# Patient Record
Sex: Female | Born: 1980 | Race: Black or African American | Hispanic: No | Marital: Married | Smoking: Never smoker
Health system: Southern US, Community
[De-identification: ages and names within clinical notes are randomized; demographics above are authoritative.]

## PROBLEM LIST (undated history)

## (undated) DIAGNOSIS — Z789 Other specified health status: Secondary | ICD-10-CM

## (undated) HISTORY — DX: Other specified health status: Z78.9

## (undated) HISTORY — PX: NO PAST SURGERIES: SHX2092

---
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
## 2002-10-25 ENCOUNTER — Emergency Department (HOSPITAL_COMMUNITY): Admission: EM | Admit: 2002-10-25 | Discharge: 2002-10-25 | Payer: Self-pay | Admitting: Emergency Medicine

## 2003-11-10 ENCOUNTER — Inpatient Hospital Stay (HOSPITAL_COMMUNITY): Admission: AD | Admit: 2003-11-10 | Discharge: 2003-11-21 | Payer: Self-pay | Admitting: Psychiatry

## 2005-01-05 ENCOUNTER — Ambulatory Visit (HOSPITAL_COMMUNITY): Admission: RE | Admit: 2005-01-05 | Discharge: 2005-01-05 | Payer: Self-pay | Admitting: Obstetrics & Gynecology

## 2005-03-28 ENCOUNTER — Ambulatory Visit (HOSPITAL_COMMUNITY): Admission: RE | Admit: 2005-03-28 | Discharge: 2005-03-28 | Payer: Self-pay | Admitting: Obstetrics

## 2005-04-14 ENCOUNTER — Ambulatory Visit (HOSPITAL_COMMUNITY): Admission: RE | Admit: 2005-04-14 | Discharge: 2005-04-14 | Payer: Self-pay | Admitting: Obstetrics

## 2005-08-21 ENCOUNTER — Inpatient Hospital Stay (HOSPITAL_COMMUNITY): Admission: AD | Admit: 2005-08-21 | Discharge: 2005-08-23 | Payer: Self-pay | Admitting: Obstetrics

## 2006-07-31 IMAGING — US US OB COMP LESS 14 WK
1 series · 18 of 28 positions shown · non-contrast
Comparison: none

CLINICAL DATA: Assess viability.

[Series 1: us ob comp less 14 wks · 18 of 41 slices shown]
[im 1/41]
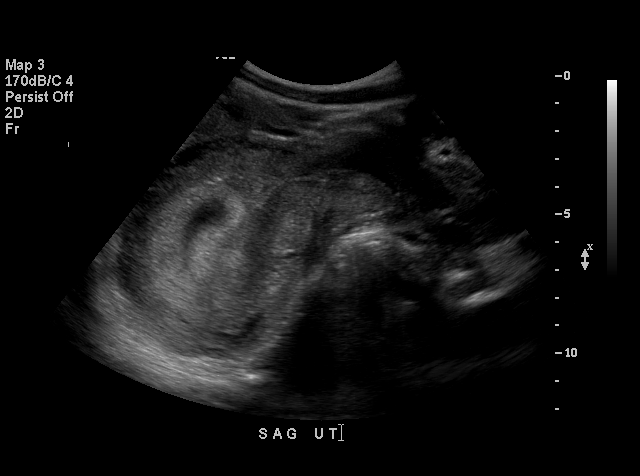
[im 3/41]
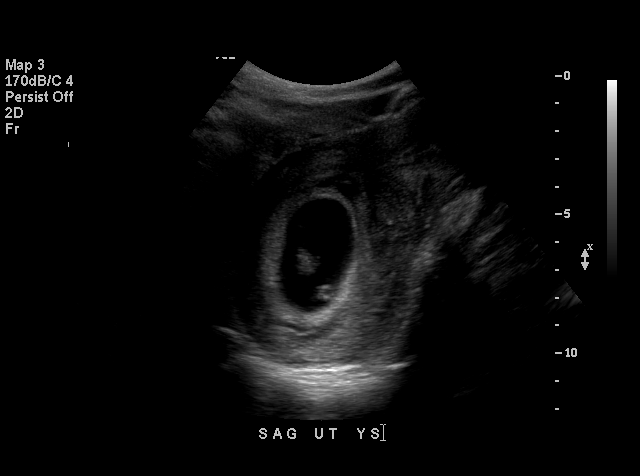
[im 5/41]
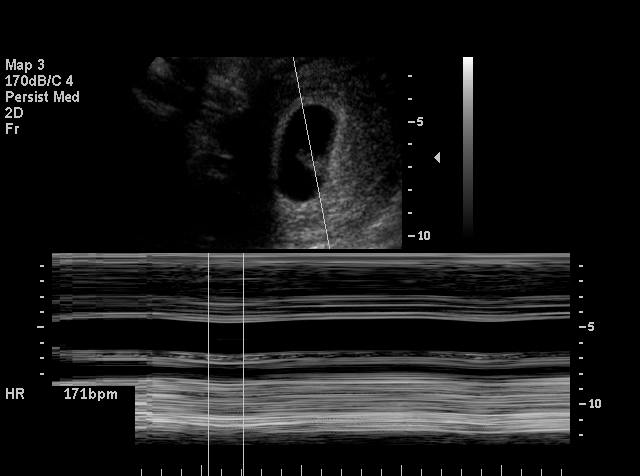
[im 8/41]
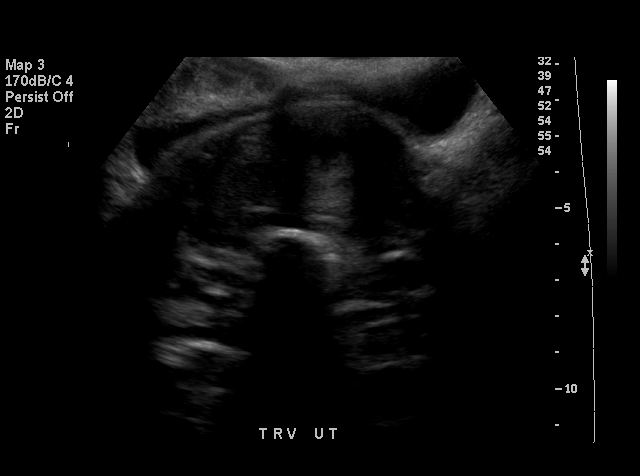
[im 11/41]
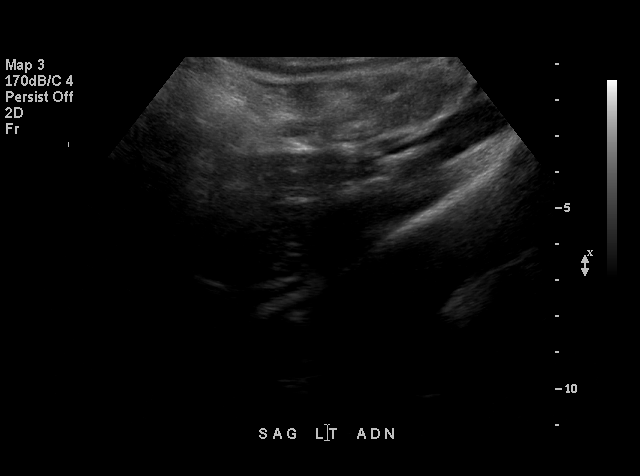
[im 12/41]
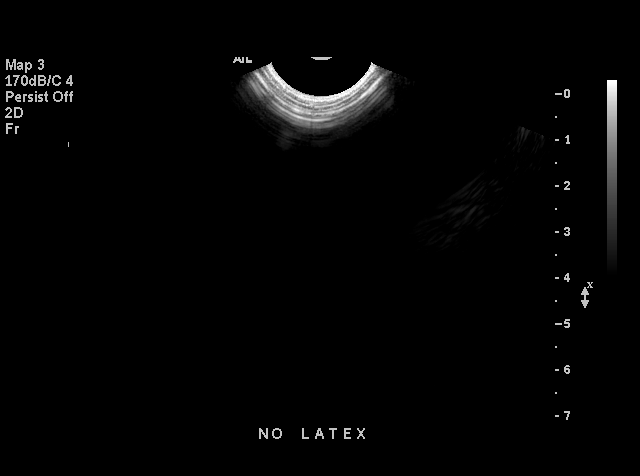
[im 15/41]
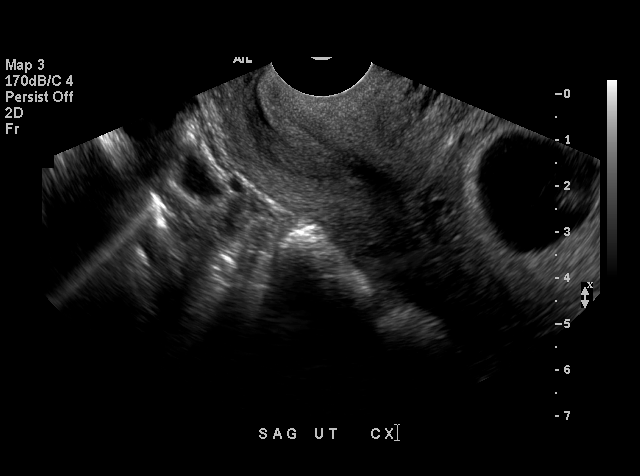
[im 17/41]
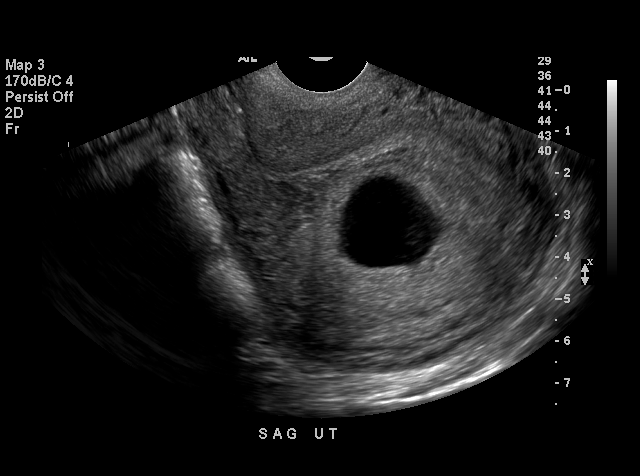
[im 20/41]
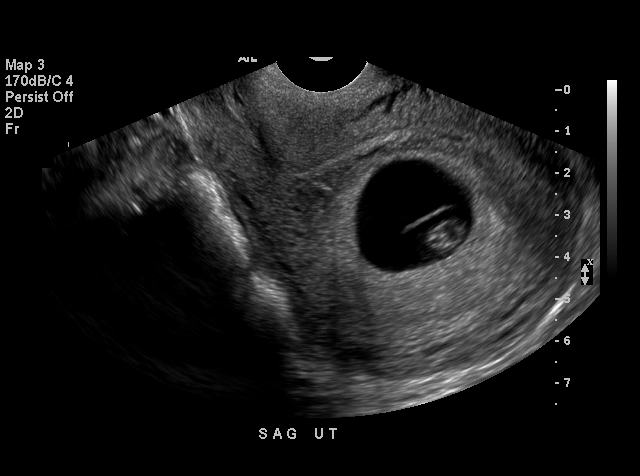
[im 21/41]
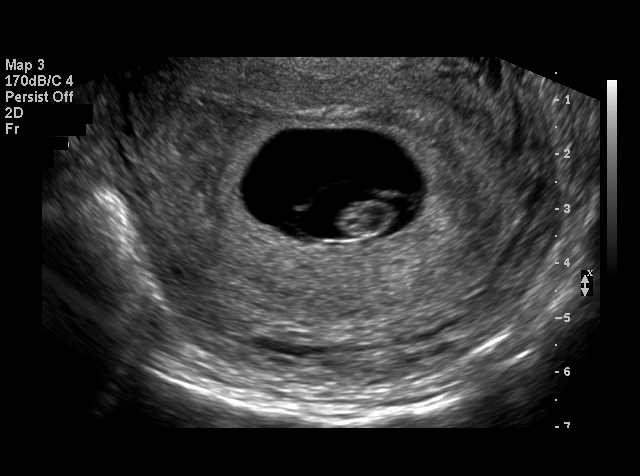
[im 24/41]
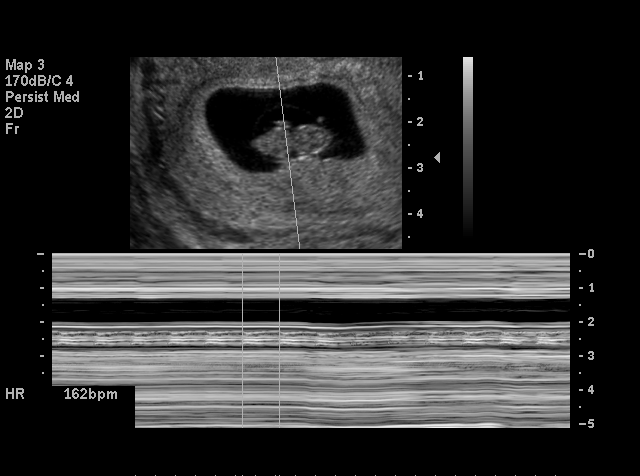
[im 26/41]
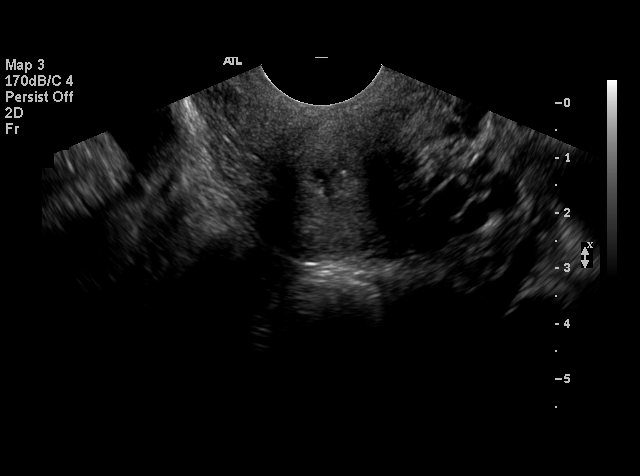
[im 29/41]
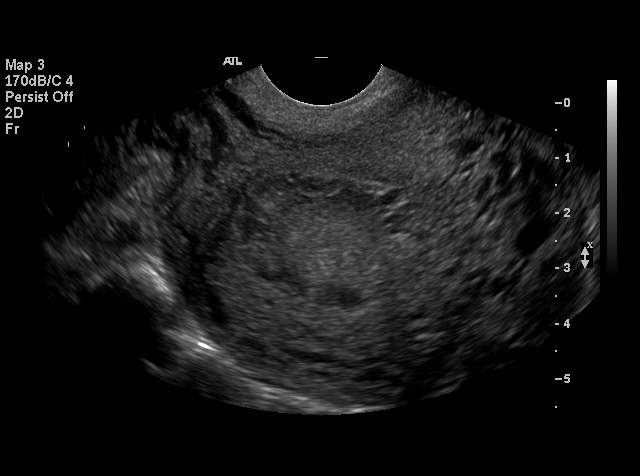
[im 32/41]
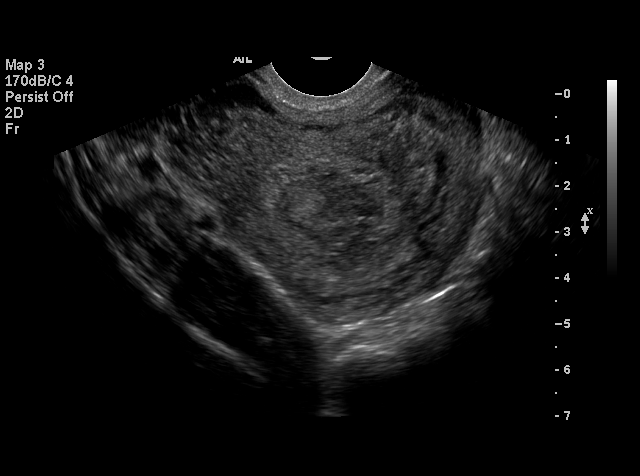
[im 33/41]
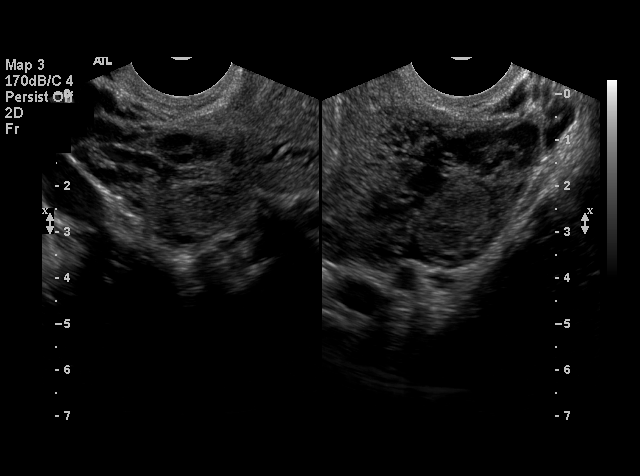
[im 36/41]
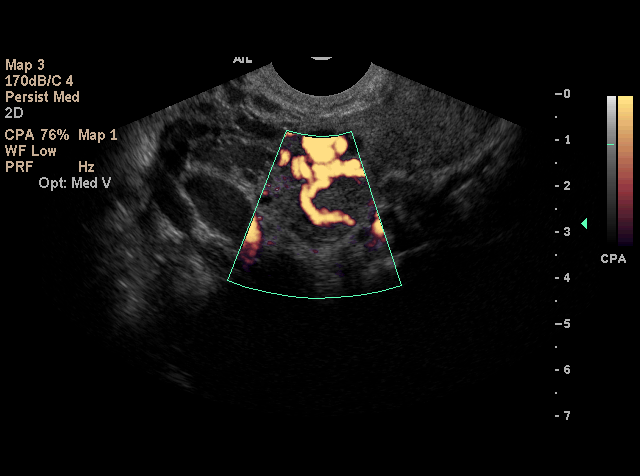
[im 38/41]
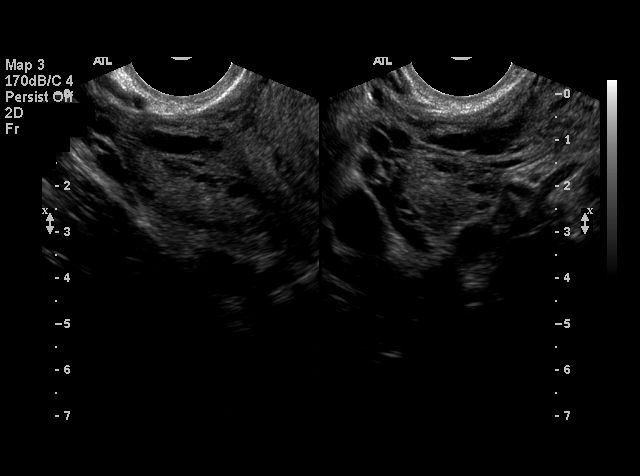
[im 41/41]
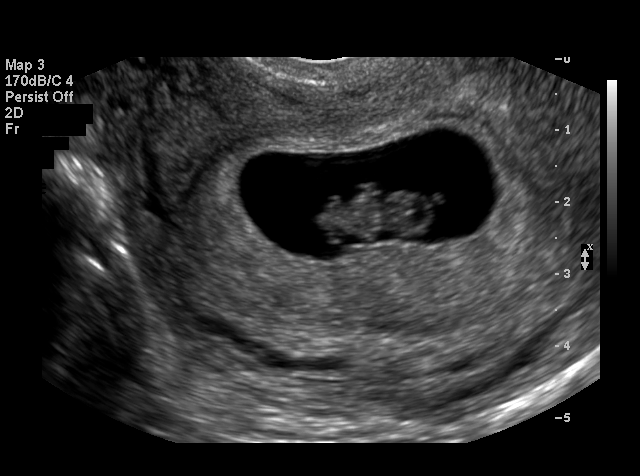

[18 of 28 positions shown; findings below may reference images not displayed]

OBSTETRICAL ULTRASOUND:

 Number of Fetuses:  1
 Heart Rate:   171 bpm
 Amniotic fluid:  Normal
 CRL:  1.8 cm   8 w 2 d

 Fetal anatomy could not be evaluated due to the early gestational age.  Yolk sac and amnion are visualized.  

 MATERNAL UTERINE AND ADNEXAL FINDINGS
 Cervix not evaluated.  
 Normal right ovary.  Resolving corpus luteum cyst on the left ovary.
IMPRESSION: 1.  Single living intrauterine gestation identified with a visualized yolk sac and embryo.  Embryonic heart rate is measured at 171 bpm.
 2.  Left ovarian corpus luteum cyst noted.  Right ovary is sonographically normal.
 3.  Estimated gestational age is 8 weeks 2 days by crown rump length.

## 2007-02-22 ENCOUNTER — Ambulatory Visit (HOSPITAL_COMMUNITY): Admission: RE | Admit: 2007-02-22 | Discharge: 2007-02-22 | Payer: Self-pay | Admitting: Obstetrics

## 2007-07-21 ENCOUNTER — Inpatient Hospital Stay (HOSPITAL_COMMUNITY): Admission: AD | Admit: 2007-07-21 | Discharge: 2007-07-23 | Payer: Self-pay | Admitting: Obstetrics & Gynecology

## 2008-10-07 ENCOUNTER — Ambulatory Visit (HOSPITAL_COMMUNITY): Admission: RE | Admit: 2008-10-07 | Discharge: 2008-10-07 | Payer: Self-pay | Admitting: Obstetrics

## 2009-03-09 ENCOUNTER — Inpatient Hospital Stay (HOSPITAL_COMMUNITY): Admission: AD | Admit: 2009-03-09 | Discharge: 2009-03-11 | Payer: Self-pay | Admitting: Obstetrics & Gynecology

## 2010-05-02 IMAGING — US US OB DETAIL+14 WK
1 series · 14 of 28 positions shown · non-contrast
Comparison: none

OBSTETRICAL ULTRASOUND:
 This ultrasound exam was performed in the [HOSPITAL] Ultrasound Department.  The OB US report was generated in the AS system, and faxed to the ordering physician.  This report is also available in [REDACTED] PACS.

[Series 1: us ob detail +14 wk · 0.22mm/px · 86 acquisitions, 14 frames shown]
[im 4/86]
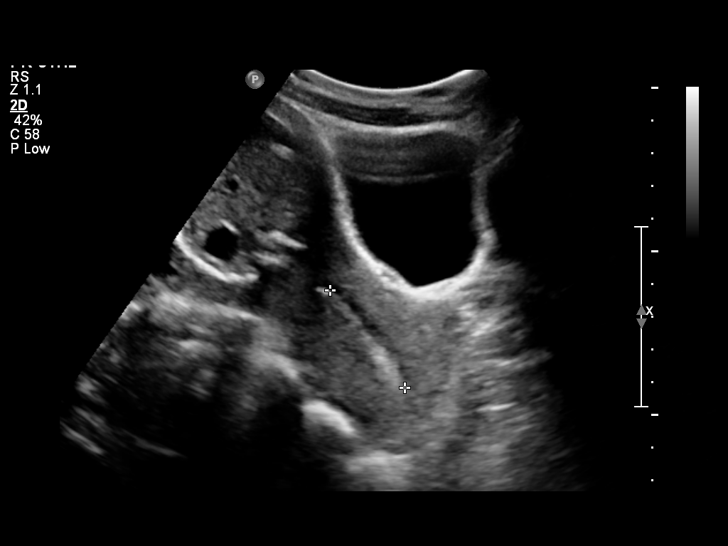
[im 10/86]
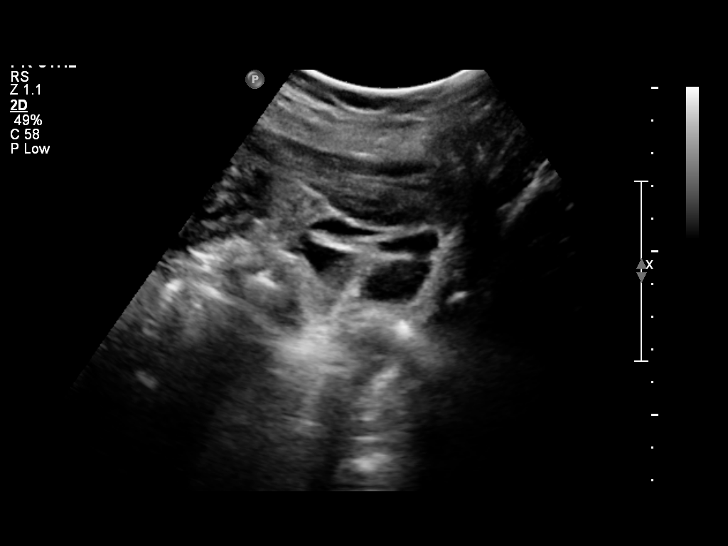
[im 16/86]
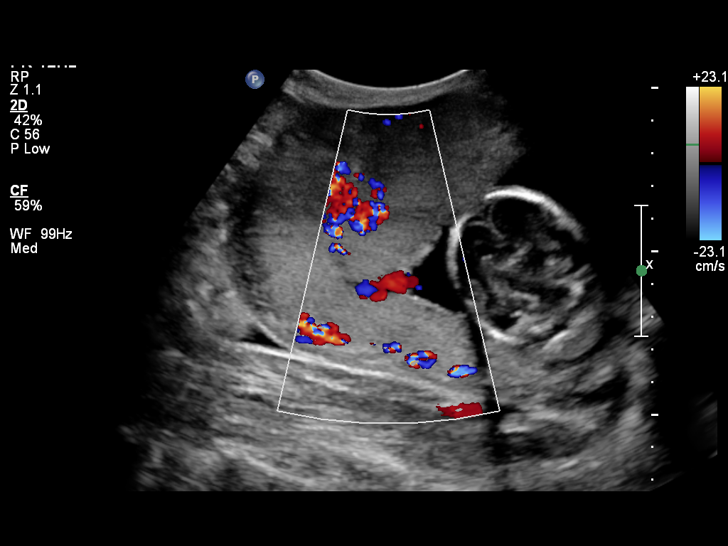
[im 23/86]
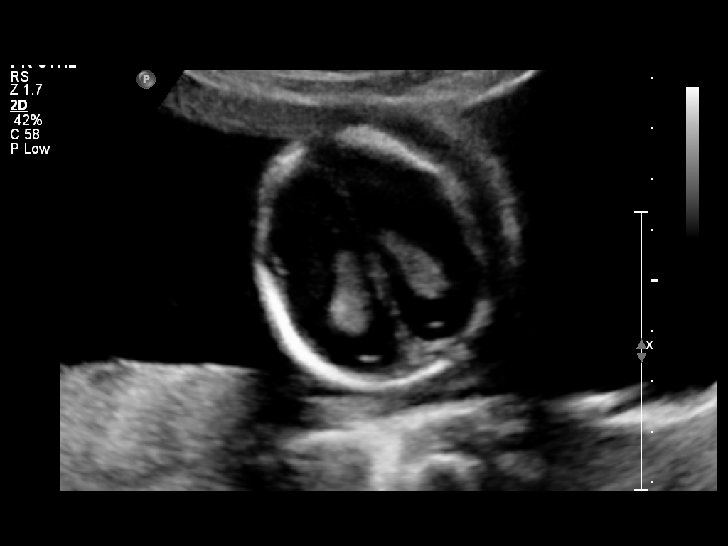
[im 29/86]
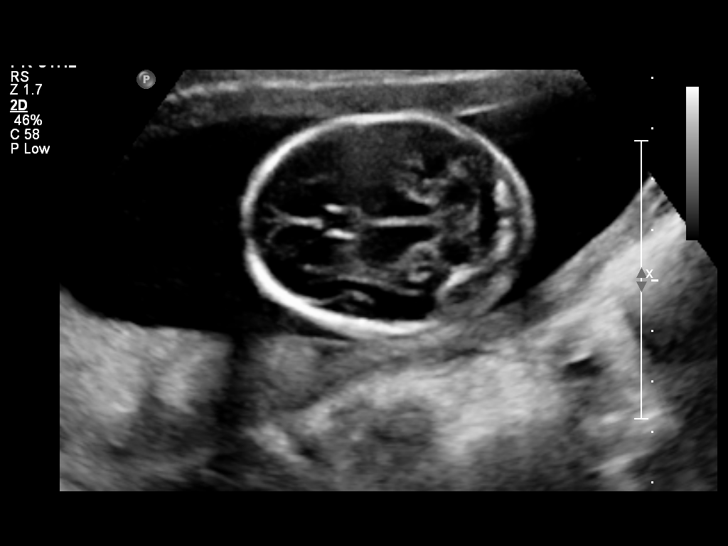
[im 35/86]
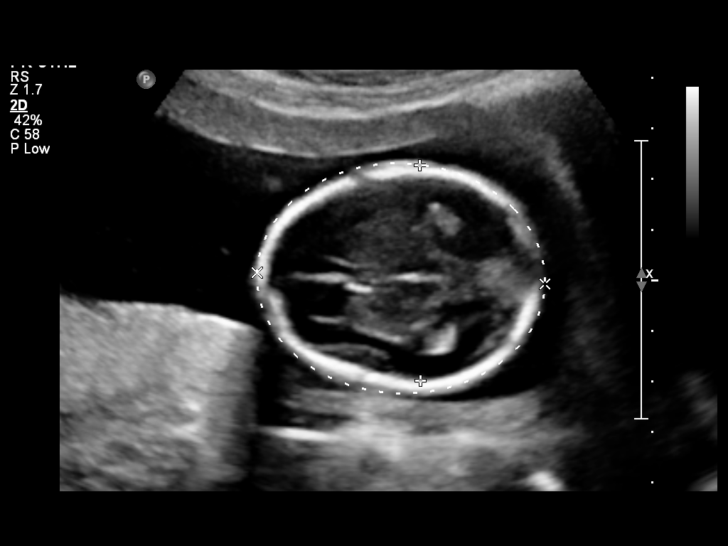
[im 41/86]
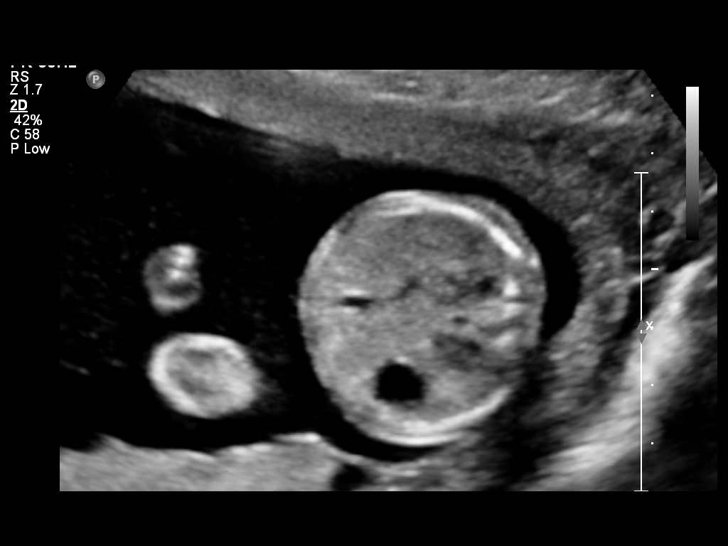
[im 48/86]
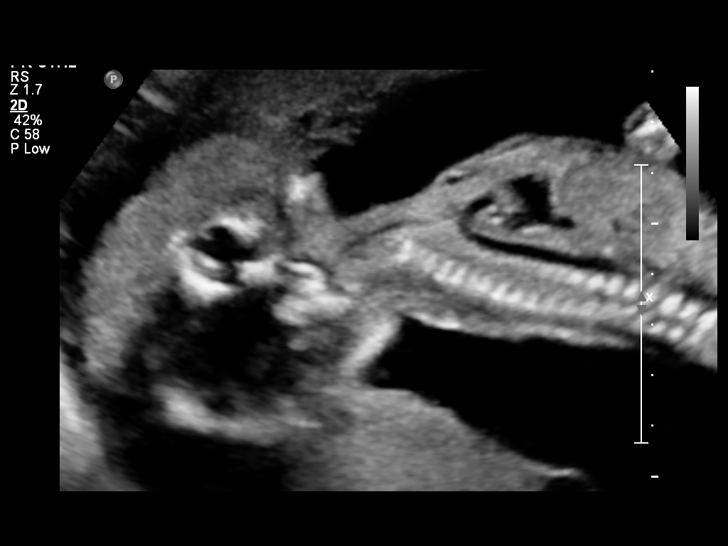
[im 54/86]
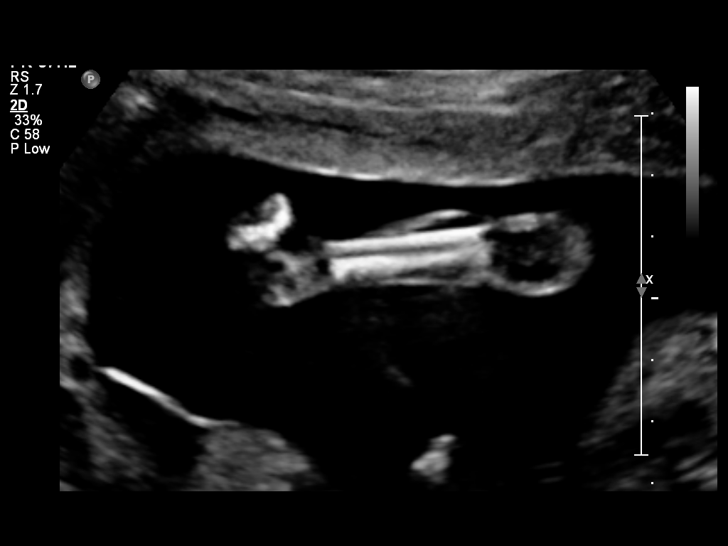
[im 60/86]
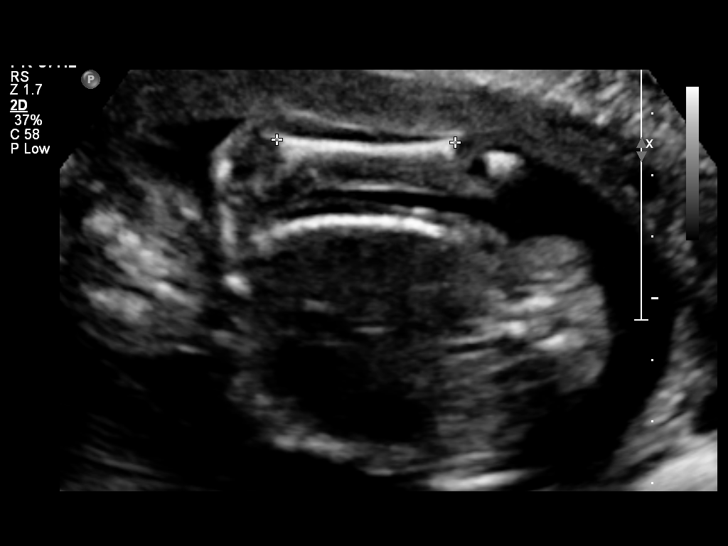
[im 67/86]
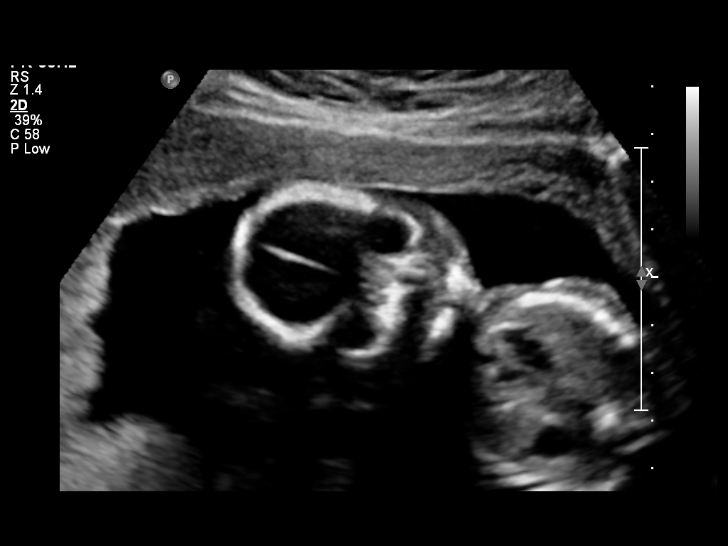
[im 73/86]
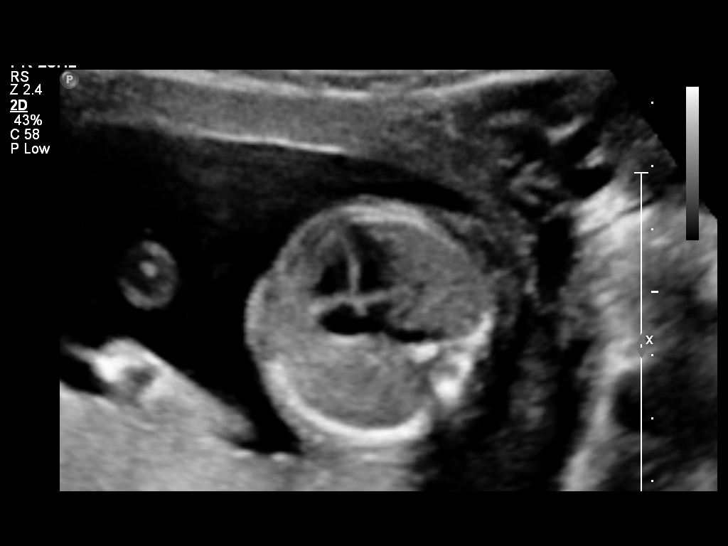
[im 79/86]
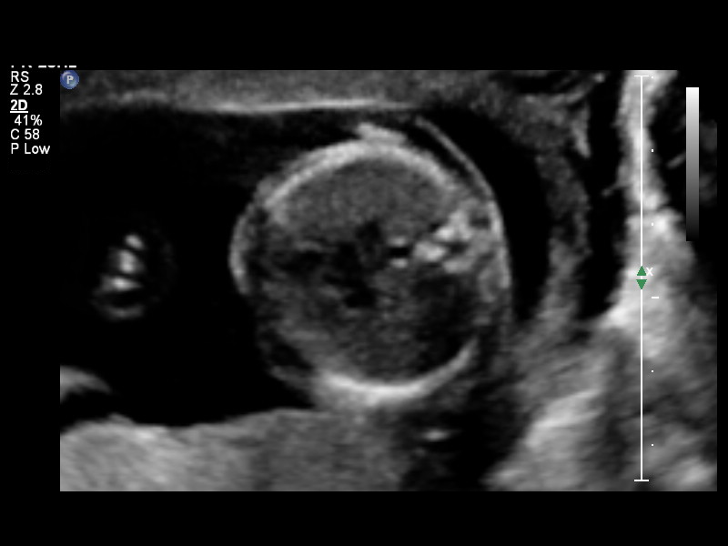
[im 86/86]
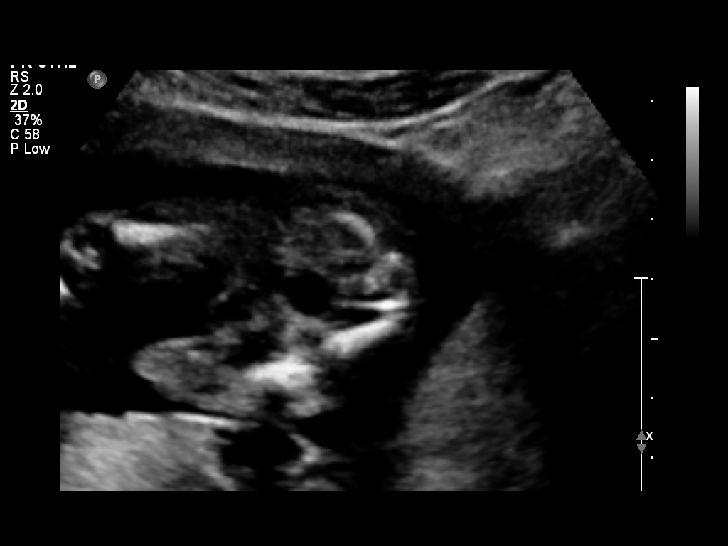

[14 of 28 positions shown; findings below may reference images not displayed]

IMPRESSION: See AS Obstetric US report.

## 2010-07-28 LAB — CBC
HCT: 34.3 % — ABNORMAL LOW (ref 36.0–46.0)
MCHC: 32.9 g/dL (ref 30.0–36.0)
MCV: 82.8 fL (ref 78.0–100.0)
Platelets: 118 10*3/uL — ABNORMAL LOW (ref 150–400)
RBC: 4.14 MIL/uL (ref 3.87–5.11)
RDW: 15.9 % — ABNORMAL HIGH (ref 11.5–15.5)
RDW: 16.7 % — ABNORMAL HIGH (ref 11.5–15.5)
WBC: 15.4 10*3/uL — ABNORMAL HIGH (ref 4.0–10.5)

## 2010-09-07 NOTE — H&P (Signed)
Elizabeth Boyd, Elizabeth Boyd                  ACCOUNT NO.:  [id number]   MEDICAL RECORD NO.:  [id number]          PATIENT TYPE:  INP   LOCATION:  9132                          FACILITY:  [facility]   PHYSICIAN:  Roseanna Rainbow, M.D.DATE OF BIRTH:  [birth year]   DATE OF ADMISSION:  07/21/2007  DATE OF DISCHARGE:                              HISTORY & PHYSICAL   CHIEF COMPLAINT:  The patient is a 30 year old para 2 with an estimated  date of confinement of July 17, 2007, with an intrauterine pregnancy at  40 plus weeks complaining of contractions.   HISTORY OF PRESENT ILLNESS:  Please see the above.   ALLERGIES:  No known drug allergies.   MEDICATIONS:  Prenatal vitamins.   PRENATAL LABS:  GBS negative, Chlamydia probe negative.  Urine culture  and sensitivity no growth.  Pap smear negative, GC probe negative,  hepatitis B surface antigen negative, hematocrit 34.3, hemoglobin 11.6,  platelets 162,000.  Blood type is O positive, antibody screen negative,  RPR nonreactive, rubella immune, sickle cell negative.   PAST OBSTETRICAL HISTORY:  In April of 2007 she was delivered of a  liveborn female 8 pounds 8 ounces at 41 weeks vaginal delivery.  In  September of 2002 she was delivered of a liveborn female 8 pounds 4 ounces  vaginal delivery, no complications.   PAST GYN HISTORY:  Noncontributory.   PAST SURGICAL HISTORY:  She denies.   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  She is married  and living with her spouse.  Does not give any significant history of  alcohol usage.  Has no significant smoking history.  Denies illicit drug  use.   FAMILY HISTORY:  Diabetes.   PHYSICAL EXAM:  VITAL SIGNS:  Vital signs stable, afebrile.  Fetal heart  tracing reassuring.  Tocodynamometer uterine contractions every 2-4  minutes.  GENERAL:  Uncomfortable with contractions.  ABDOMEN:  Gravid.  Sterile vaginal exam, the cervix is 6 cm dilated, 90%  effaced with the vertex at a zero  station.  The forebag was ruptured for  clear fluid.   ASSESSMENT:  Multipara at term, active labor.  Fetal heart tracing  consistent with fetal well-being.   PLAN:  Admission, expectant management.      Roseanna Rainbow, M.D.  Electronically Signed     LAJ/MEDQ  D:  07/21/2007  T:  07/22/2007  Job:  [id number]

## 2010-09-10 NOTE — Discharge Summary (Signed)
[facility]  Patient:    Elizabeth Boyd, Elizabeth Boyd Visit Number: [id number] MRN: [id number]          Service Type: Dictated by:   Marlinda Mike, CNM Adm. Date:  12/29/00 Disc. Date: 12/31/00                             Discharge Summary  ADMISSION DIAGNOSIS:  Intrauterine pregnancy at 40-3/7 weeks in active labor.  DISCHARGE DIAGNOSIS:  Postpartum day #2, status post spontaneous vaginal birth, stable condition, bottle feeding.  HISTORY OF PRESENT ILLNESS:  The patient is an 30 year old gravida 1, para 0-0-0, at 40-3/7 weeks with an estimated date of confinement of 12/26/00.  The patient received prenatal care at [facility] with the onset of care at 9 weeks.  Prenatal course complicated with history of marijuana use, first trimester spotting.  PRENATAL LABORATORY DATA:  The patient is O+, antibody screen negative, rubella immune, RPR status is negative.  HIV status is negative.  GC and chlamydia cultures are negative.  Group beta strep culture was negative. Hepatitis status was negative.  HOSPITAL COURSE:  The patient was admitted into labor and delivery in active labor.  Vital signs stable.  Admission hemoglobin 13.2, hematocrit 38.7, platelet count 115, white blood cell count 11.6.  The patient had artificial rupture of membranes with clear fluid at 8 cm dilation.  The patient had epidural anesthesia in active labor for pain management.  Fetal heart rate tracing reactive in labor.  The patient had a spontaneous vaginal birth of a female infant.  Apgars were 8 and 9.  Placenta spontaneous and intact with a three vessel cord.  Pitocin 20 units to IV fluid for hemostasis.  Small superficial abrasion at the mid point of the perineum, hemostatic, no repair needed.  Infant weighed 8 pounds 4 ounces.  Postpartum course was uneventful. Vital signs remained stable, afebrile.  The patient desires Depo-Provera at discharge.  On postpartum day #2, vital signs  remained stable.  Comprehensive metabolic labs stable.  Platelet count 123 on day of discharge.  Resolving thrombocytopenia of unknown etiology.  The patient is discharged in stable condition, bottle feeding, Depo-Provera 150 mg IM for contraception.  FOLLOWUP:  The patient is to follow up at [facility] at six weeks postpartum.  The patient is discharged home with infant in care of family.  DISCHARGE MEDICATIONS: 1. Motrin 600 mg p.o. q.6-8h. p.r.n. 2. Tylenol p.r.n. 3. Continue prenatal vitamins postpartum. Dictated by:   Marlinda Mike, CNM DD:  05/03/01 TD:  05/03/01 Job: [id number]

## 2010-09-10 NOTE — Discharge Summary (Signed)
NAME:  Elizabeth Boyd, Elizabeth Boyd                            ACCOUNT NO.:  [id number]   MEDICAL RECORD NO.:  [id number]                   PATIENT TYPE:  IPS   LOCATION:  0402                                 FACILITY:  [facility]   PHYSICIAN:  Geoffery Lyons, M.D.                   DATE OF BIRTH:  Oct 24, 1980   DATE OF ADMISSION:  11/10/2003  DATE OF DISCHARGE:  11/21/2003                                 DISCHARGE SUMMARY   CHIEF COMPLAINT AND PRESENTING ILLNESS:  This was the first admission to  [facility]  for this 30 year old single female,  involuntarily committed.  Referred by mental health for psychotic  depression, hearing voices, running around outside, throwing things in the  yard.  Upon initial evaluation, intermittently distracted.  Speech was  nonsensical, unable to get coherent answers.   PAST PSYCHIATRIC HISTORY:  Sees Dr. Hortencia Pilar and Harolyn Rutherford.  First time  [facility], last times hospitalized July 2004.   ALCOHOL AND DRUG HISTORY:  Urine drug screen positive for marijuana, no  other substances.   PAST MEDICAL HISTORY:  Noncontributory other than vitiligo.   MEDICATIONS:  Risperdal 2 mg January 25, compliance questionable.   PHYSICAL EXAMINATION:  Performed, failed to show any acute findings.   LABORATORY WORKUP:  Blood chemistries were within normal limits.  Liver  profile within normal limits.   MENTAL STATUS EXAM:  Reveals a female, eyes closed, opened them and looked  at the interviewer, smoothing the hair.  Speech was bizarre, nonsensical,  unable to respond to questions, responding I don't know, somewhat labile,  not processing, unable to follow the conversation.   ADMISSION DIAGNOSES:   AXIS I:  1. Rule out schizophrenia.  2. Cannabis abuse.   AXIS II:  No diagnosis.   AXIS III:  No diagnosis.   AXIS IV:  Moderate.   AXIS V:  Global assessment of function upon admission 15-20, highest global  assessment of function in past year  unknown.   COURSE IN HOSPITAL:  She was admitted and started on intensive individual  and group psychotherapy.  She was given Risperdal M-tab as needed, Ativan.  Ativan was later discontinued.  Continued to work with Risperdal 1 mg in the  morning and 2 at 6 p.m.  Was given some Cogentin.  She required some p.r.n.  She had to be placed on one on one observation due to safety concerns.  She  was given Zyprexa Zydis 5 every 4 hours as needed.  It seemed that she  responded better to the Zyprexa so we switched from Risperdal to Zyprexa.  She did evidence acute psychotic behavior.  At one time, she laughed,  giggled, fell on her knees and stated that she could not stand, that she  could not see, stating that she was blind.  Exhibited repetition of answers,  red, yellow, green, black, white can all  come.  When asked what her name was  she said I don't know.  I don't think I ever know my name.  She was  evidencing response to internal stimuli.  She sat on the bed, laughing and  talking to herself, playing with the bed control buttons, making the bed go  up all the say and then down.  She was disoriented, evidencing  tangentiality, lability in affect.  As the response to Zyprexa seemed to  have been more effective, we went ahead and switched to Zyprexa 5 twice a  day and 10 at night.  She started getting more together but maintained being  religiously preoccupied, very labile.  Became upset because there was no  communication with the family and we learned that they were out of town.  She became more cooperative as time went by.  She was pretty disorganized.  Very disorganized thinking, bizarre statements, hyper verbal, hyper  religiosity.  So went ahead and considering this affective component we  started Depakote 250 twice a day that she tolerated very well.  She  continued to improve.  By July 26 she was more rational, less disorganized,  exhibited more insight, started inquiring when she was  able to go home.  The  family was going to be available the following day.  There was a family  session.  On July 28, she was ready to go, endorsed no concerns.  She was  willing to take her medications.  She denied any suicidal or homicidal  ideas.  There was no evidence of hallucinations, she was willing to maintain  the medications and go for treatment, so we went ahead and discharged to  outpatient follow-up.   DISCHARGE DIAGNOSES:   AXIS I:  1. Schizoaffective disorder.  2. Cannabis abuse.   AXIS II:  No diagnosis.   AXIS III:  No diagnosis.   AXIS IV:  Moderate.   AXIS V:  Global assessment of function upon discharge 50.   DISCHARGE MEDICATIONS:  1. Cogentin 1 mg twice a day.  2. Zyprexa 5 twice a day and 10 at night.  3. Depakote ER 250 twice a day.   DISPOSITION:  Follow up with Dr. Lang Snow at [facility].                                               Geoffery Lyons, M.D.    IL/MEDQ  D:  12/10/2003  T:  12/11/2003  Job:  [id number]

## 2011-01-17 LAB — CBC
HCT: 32 — ABNORMAL LOW
Hemoglobin: 10.7 — ABNORMAL LOW
MCHC: 33.6
MCV: 80.9
Platelets: 164
RBC: 4.4
RDW: 17.8 — ABNORMAL HIGH
RDW: 17.9 — ABNORMAL HIGH
WBC: 10.7 — ABNORMAL HIGH
WBC: 13.9 — ABNORMAL HIGH

## 2013-01-24 ENCOUNTER — Ambulatory Visit (INDEPENDENT_AMBULATORY_CARE_PROVIDER_SITE_OTHER): Payer: Medicaid Other | Admitting: Obstetrics

## 2013-01-24 ENCOUNTER — Encounter: Payer: Self-pay | Admitting: Obstetrics

## 2013-01-24 VITALS — Temp 98.3°F | Ht 64.0 in | Wt 149.0 lb

## 2013-01-24 DIAGNOSIS — N943 Premenstrual tension syndrome: Secondary | ICD-10-CM

## 2013-01-24 DIAGNOSIS — N760 Acute vaginitis: Secondary | ICD-10-CM

## 2013-01-24 DIAGNOSIS — N946 Dysmenorrhea, unspecified: Secondary | ICD-10-CM

## 2013-01-24 DIAGNOSIS — Z113 Encounter for screening for infections with a predominantly sexual mode of transmission: Secondary | ICD-10-CM

## 2013-01-24 LAB — COMPREHENSIVE METABOLIC PANEL
Albumin: 4.2 g/dL (ref 3.5–5.2)
Alkaline Phosphatase: 55 U/L (ref 39–117)
CO2: 24 mEq/L (ref 19–32)
Chloride: 107 mEq/L (ref 96–112)
Creat: 0.79 mg/dL (ref 0.50–1.10)
Potassium: 4 mEq/L (ref 3.5–5.3)
Sodium: 138 mEq/L (ref 135–145)

## 2013-01-24 LAB — TSH: TSH: 0.697 u[IU]/mL (ref 0.350–4.500)

## 2013-01-24 LAB — CHOLESTEROL, TOTAL: Cholesterol: 168 mg/dL (ref 0–200)

## 2013-01-24 MED ORDER — PNV PRENATAL PLUS MULTIVITAMIN 27-1 MG PO TABS: 1.0000 | ORAL_TABLET | Freq: Every day | ORAL | Status: DC

## 2013-01-24 MED ORDER — PAROXETINE MESYLATE 7.5 MG PO CAPS: 1.0000 | ORAL_CAPSULE | Freq: Every day | ORAL | Status: DC

## 2013-01-24 MED ORDER — IBUPROFEN 800 MG PO TABS
800.0000 mg | ORAL_TABLET | Freq: Three times a day (TID) | ORAL | Status: DC | PRN
Start: 2013-01-24 — End: 2013-11-20

## 2013-01-24 NOTE — Progress Notes (Signed)
.   Subjective:     Elizabeth Boyd is a 32 y.o. female here for a routine exam.  No current complaints.  Personal health questionnaire reviewed: yes.   Gynecologic History Patient's last menstrual period was 12/28/2012. Contraception: none Last Pap: 2013. Results were: normal Last mammogram: N/A  Obstetric History OB History  No data available     The following portions of the patient's history were reviewed and updated as appropriate: allergies, current medications, past family history, past medical history, past social history, past surgical history and problem list.  Review of Systems Pertinent items are noted in HPI.    Objective:    General appearance: alert and no distress Abdomen: normal findings: soft, non-tender Pelvic: cervix normal in appearance, external genitalia normal, no adnexal masses or tenderness, no cervical motion tenderness, uterus normal size, shape, and consistency and vagina normal without discharge    Assessment:    Healthy female exam.   PMS   Plan:    Follow up in: 3 months.    [facility].

## 2013-01-25 LAB — HIV ANTIBODY (ROUTINE TESTING W REFLEX): HIV: NONREACTIVE

## 2013-01-25 LAB — GC/CHLAMYDIA PROBE AMP
CT Probe RNA: NEGATIVE
GC Probe RNA: NEGATIVE

## 2013-01-25 LAB — RPR

## 2013-02-28 ENCOUNTER — Other Ambulatory Visit: Payer: Self-pay

## 2013-04-29 ENCOUNTER — Ambulatory Visit: Payer: Medicaid Other | Admitting: Obstetrics

## 2013-05-15 ENCOUNTER — Ambulatory Visit: Payer: Medicaid Other | Admitting: Obstetrics

## 2013-11-20 ENCOUNTER — Encounter: Payer: Self-pay | Admitting: Obstetrics

## 2013-11-20 ENCOUNTER — Ambulatory Visit (INDEPENDENT_AMBULATORY_CARE_PROVIDER_SITE_OTHER): Payer: Medicaid Other | Admitting: Obstetrics

## 2013-11-20 VITALS — BP 117/78 | HR 73 | Temp 98.2°F | Ht 64.0 in | Wt 127.0 lb

## 2013-11-20 DIAGNOSIS — Z0000 Encounter for general adult medical examination without abnormal findings: Secondary | ICD-10-CM

## 2013-11-20 DIAGNOSIS — Z113 Encounter for screening for infections with a predominantly sexual mode of transmission: Secondary | ICD-10-CM

## 2013-11-20 LAB — CBC WITH DIFFERENTIAL/PLATELET
Basophils Absolute: 0 10*3/uL (ref 0.0–0.1)
Basophils Relative: 0 % (ref 0–1)
EOS PCT: 1 % (ref 0–5)
Eosinophils Absolute: 0.1 10*3/uL (ref 0.0–0.7)
HCT: 39.9 % (ref 36.0–46.0)
Hemoglobin: 13.2 g/dL (ref 12.0–15.0)
Lymphocytes Relative: 46 % (ref 12–46)
Lymphs Abs: 2.3 10*3/uL (ref 0.7–4.0)
MCH: 28.6 pg (ref 26.0–34.0)
MCHC: 33.1 g/dL (ref 30.0–36.0)
MCV: 86.4 fL (ref 78.0–100.0)
MONO ABS: 0.4 10*3/uL (ref 0.1–1.0)
MONOS PCT: 7 % (ref 3–12)
Neutro Abs: 2.3 10*3/uL (ref 1.7–7.7)
Neutrophils Relative %: 46 % (ref 43–77)
Platelets: 236 10*3/uL (ref 150–400)
RBC: 4.62 MIL/uL (ref 3.87–5.11)
RDW: 13.9 % (ref 11.5–15.5)
WBC: 5 10*3/uL (ref 4.0–10.5)

## 2013-11-20 MED ORDER — PNV PRENATAL PLUS MULTIVITAMIN 27-1 MG PO TABS: 1.0000 | ORAL_TABLET | Freq: Every day | ORAL | Status: DC

## 2013-11-20 NOTE — Progress Notes (Signed)
Subjective:     Elizabeth Boyd is a 33 y.o. female here for a routine exam.  Current complaints: None.    Personal health questionnaire:  Is patient [name], have a family history of breast and/or ovarian cancer: no Is there a family history of uterine cancer diagnosed at age < 6650, gastrointestinal cancer, urinary tract cancer, family member who is a Personnel officerLynch syndrome-associated carrier: no Is the patient overweight and hypertensive, family history of diabetes, personal history of gestational diabetes or PCOS: no Is patient over 7255, have PCOS,  family history of premature CHD under age 32, diabetes, smoke, have hypertension or peripheral artery disease:  no At any time, has a partner hit, kicked or otherwise hurt or frightened you?: no Over the past 2 weeks, have you felt down, depressed or hopeless?: no Over the past 2 weeks, have you felt little interest or pleasure in doing things?:no   Gynecologic History Patient's last menstrual period was 11/06/2013. Contraception: condoms Last Pap: 2014. Results were: normal Last mammogram: n/a. Results were: n/a  Obstetric History OB History  Gravida Para Term Preterm AB SAB TAB Ectopic Multiple Living  4 4 4       4     # Outcome Date GA Lbr Len/2nd Weight Sex Delivery Anes PTL Lv  4 TRM         Y  3 TRM         Y  2 TRM         Y  1 TRM         Y      History reviewed. No pertinent past medical history.  History reviewed. No pertinent past surgical history.  Current outpatient prescriptions:Prenatal Vit-Fe Fumarate-FA (PNV PRENATAL PLUS MULTIVITAMIN) 27-1 MG TABS, Take 1 tablet by mouth daily before breakfast., Disp: 30 tablet, Rfl: 11 No Known Allergies  History  Substance Use Topics  . Smoking status: Never Smoker   . Smokeless tobacco: Not on file  . Alcohol Use: No    Family History  Problem Relation Age of Onset  . Diabetes Paternal Grandmother       Review of Systems  Constitutional: negative for fatigue and  weight loss Respiratory: negative for cough and wheezing Cardiovascular: negative for chest pain, fatigue and palpitations Gastrointestinal: negative for abdominal pain and change in bowel habits Musculoskeletal:negative for myalgias Neurological: negative for gait problems and tremors Behavioral/Psych: negative for abusive relationship, depression Endocrine: negative for temperature intolerance   Genitourinary:negative for abnormal menstrual periods, genital lesions, hot flashes, sexual problems and vaginal discharge Integument/breast: negative for breast lump, breast tenderness, nipple discharge and skin lesion(s)    Objective:       BP 117/78  Pulse 73  Temp(Src) 98.2 F (36.8 C)  Ht 5\' 4"  (1.626 m)  Wt 127 lb (57.607 kg)  BMI 21.79 kg/m2  LMP 11/06/2013 General:   alert  Skin:   no rash or abnormalities  Lungs:   clear to auscultation bilaterally  Heart:   regular rate and rhythm, S1, S2 normal, no murmur, click, rub or gallop  Breasts:   normal without suspicious masses, skin or nipple changes or axillary nodes  Abdomen:  normal findings: no organomegaly, soft, non-tender and no hernia  Pelvis:  External genitalia: normal general appearance Urinary system: urethral meatus normal and bladder without fullness, nontender Vaginal: normal without tenderness, induration or masses Cervix: normal appearance Adnexa: normal bimanual exam Uterus: anteverted and non-tender, normal size   Lab Review Urine pregnancy test  Labs reviewed yes Radiologic studies reviewed no    Assessment:    Healthy female exam.    Plan:    Education reviewed: calcium supplements, safe sex/STD prevention and self breast exams. Contraception: condoms. Follow up in: 1 year.   Meds ordered this encounter  Medications  . Prenatal Vit-Fe Fumarate-FA (PNV PRENATAL PLUS MULTIVITAMIN) 27-1 MG TABS    Sig: Take 1 tablet by mouth daily before breakfast.    Dispense:  30 tablet    Refill:  11    Orders Placed This Encounter  Procedures  . CBC with Differential  . Comprehensive metabolic panel  . TSH  . Lipid Profile  . HIV antibody  . Hepatitis B surface antigen  . RPR  . Hepatitis C antibody

## 2013-11-20 NOTE — Addendum Note (Signed)
Addended by: Odessa FlemingBOHNE, Nataliya Graig M on: 11/20/2013 05:11 PM   Modules accepted: Orders

## 2013-11-21 LAB — TSH: TSH: 0.71 u[IU]/mL (ref 0.350–4.500)

## 2013-11-21 LAB — COMPREHENSIVE METABOLIC PANEL
ALBUMIN: 4.6 g/dL (ref 3.5–5.2)
ALT: 14 U/L (ref 0–35)
AST: 14 U/L (ref 0–37)
Alkaline Phosphatase: 49 U/L (ref 39–117)
BILIRUBIN TOTAL: 0.5 mg/dL (ref 0.2–1.2)
BUN: 9 mg/dL (ref 6–23)
CALCIUM: 9.6 mg/dL (ref 8.4–10.5)
CHLORIDE: 105 meq/L (ref 96–112)
CO2: 24 mEq/L (ref 19–32)
CREATININE: 0.79 mg/dL (ref 0.50–1.10)
Glucose, Bld: 84 mg/dL (ref 70–99)
POTASSIUM: 4 meq/L (ref 3.5–5.3)
SODIUM: 139 meq/L (ref 135–145)
TOTAL PROTEIN: 7.5 g/dL (ref 6.0–8.3)

## 2013-11-21 LAB — LIPID PANEL
Cholesterol: 166 mg/dL (ref 0–200)
HDL: 50 mg/dL (ref >39)
LDL Cholesterol: 101 mg/dL — ABNORMAL HIGH (ref 0–99)
TRIGLYCERIDES: 77 mg/dL (ref <150)
Total CHOL/HDL Ratio: 3.3 Ratio
VLDL: 15 mg/dL (ref 0–40)

## 2013-11-21 LAB — HEPATITIS C ANTIBODY: HCV AB: NEGATIVE

## 2013-11-21 LAB — WET PREP BY MOLECULAR PROBE
CANDIDA SPECIES: NEGATIVE
GARDNERELLA VAGINALIS: POSITIVE — AB
TRICHOMONAS VAG: NEGATIVE

## 2013-11-21 LAB — RPR

## 2013-11-21 LAB — PAP IG AND HPV HIGH-RISK: HPV DNA HIGH RISK: NOT DETECTED

## 2013-11-21 LAB — HEPATITIS B SURFACE ANTIGEN: Hepatitis B Surface Ag: NEGATIVE

## 2013-11-21 LAB — HIV ANTIBODY (ROUTINE TESTING W REFLEX): HIV 1&2 Ab, 4th Generation: NONREACTIVE

## 2013-11-22 LAB — GC/CHLAMYDIA PROBE AMP
CT Probe RNA: NEGATIVE
GC Probe RNA: NEGATIVE

## 2013-11-29 ENCOUNTER — Other Ambulatory Visit: Payer: Self-pay | Admitting: *Deleted

## 2013-11-29 DIAGNOSIS — B9689 Other specified bacterial agents as the cause of diseases classified elsewhere: Secondary | ICD-10-CM

## 2013-11-29 DIAGNOSIS — N760 Acute vaginitis: Principal | ICD-10-CM

## 2013-11-29 MED ORDER — METRONIDAZOLE 500 MG PO TABS: 500.0000 mg | ORAL_TABLET | Freq: Two times a day (BID) | ORAL | Status: DC

## 2014-02-24 ENCOUNTER — Encounter: Payer: Self-pay | Admitting: Obstetrics

## 2014-11-19 ENCOUNTER — Encounter: Payer: Self-pay | Admitting: Obstetrics

## 2014-11-19 ENCOUNTER — Ambulatory Visit (INDEPENDENT_AMBULATORY_CARE_PROVIDER_SITE_OTHER): Payer: Medicaid Other | Admitting: Obstetrics

## 2014-11-19 VITALS — BP 99/65 | HR 79 | Temp 97.3°F | Wt 123.8 lb

## 2014-11-19 DIAGNOSIS — Z113 Encounter for screening for infections with a predominantly sexual mode of transmission: Secondary | ICD-10-CM | POA: Diagnosis not present

## 2014-11-19 DIAGNOSIS — Z0000 Encounter for general adult medical examination without abnormal findings: Secondary | ICD-10-CM

## 2014-11-19 MED ORDER — PNV PRENATAL PLUS MULTIVITAMIN 27-1 MG PO TABS: 1.0000 | ORAL_TABLET | Freq: Every day | ORAL | Status: DC

## 2014-11-19 NOTE — Progress Notes (Signed)
Subjective:        Elizabeth Boyd is a 34 y.o. female here for a routine exam.  Current complaints: None.  Requests General Health Panel and STD Surveillance.    Personal health questionnaire:  Is patient [name], have a family history of breast and/or ovarian cancer: no Is there a family history of uterine cancer diagnosed at age < 43, gastrointestinal cancer, urinary tract cancer, family member who is a Personnel officer syndrome-associated carrier: no Is the patient overweight and hypertensive, family history of diabetes, personal history of gestational diabetes, preeclampsia or PCOS: no Is patient over 36, have PCOS,  family history of premature CHD under age 45, diabetes, smoke, have hypertension or peripheral artery disease:  no At any time, has a partner hit, kicked or otherwise hurt or frightened you?: no Over the past 2 weeks, have you felt down, depressed or hopeless?: no Over the past 2 weeks, have you felt little interest or pleasure in doing things?:no   Gynecologic History Patient's last menstrual period was 11/08/2014. Contraception: condoms Last Pap: 2015. Results were: normal Last mammogram: n/a. Results were: n/a  Obstetric History OB History  Gravida Para Term Preterm AB SAB TAB Ectopic Multiple Living  # Outcome Date GA Lbr Len/2nd Weight Sex Delivery Anes PTL Lv  4 Term         Y  3 Term         Y  2 Term         Y  1 Term         Y      History reviewed. No pertinent past medical history.  History reviewed. No pertinent past surgical history.   Current outpatient prescriptions:  .  Prenatal Vit-Fe Fumarate-FA (PNV PRENATAL PLUS MULTIVITAMIN) 27-1 MG TABS, Take 1 tablet by mouth daily before breakfast., Disp: 30 tablet, Rfl: 11 No Known Allergies  History  Substance Use Topics  . Smoking status: Never Smoker   . Smokeless tobacco: Not on file  . Alcohol Use: No    Family History  Problem Relation Age of Onset  . Diabetes Paternal  Grandmother       Review of Systems  Constitutional: negative for fatigue and weight loss Respiratory: negative for cough and wheezing Cardiovascular: negative for chest pain, fatigue and palpitations Gastrointestinal: negative for abdominal pain and change in bowel habits Musculoskeletal:negative for myalgias Neurological: negative for gait problems and tremors Behavioral/Psych: negative for abusive relationship, depression Endocrine: negative for temperature intolerance   Genitourinary:negative for abnormal menstrual periods, genital lesions, hot flashes, sexual problems and vaginal discharge Integument/breast: negative for breast lump, breast tenderness, nipple discharge and skin lesion(s)    Objective:       BP 99/65 mmHg  Pulse 79  Temp(Src) 97.3 F (36.3 C)  Wt 123 lb 12.8 oz (56.155 kg)  LMP 11/08/2014   PE:  Deferred  Lab Review Urine pregnancy test Labs reviewed yes Radiologic studies reviewed no  100% of 10 min visit spent on counseling and coordination of care.   Assessment:    Healthy female  Routine health maintenance  STD surveillance .    Plan:    Education reviewed: safe sex/STD prevention, self breast exams and weight bearing exercise. Follow up in: several months.   Meds ordered this encounter  Medications  . Prenatal Vit-Fe Fumarate-FA (PNV PRENATAL PLUS MULTIVITAMIN) 27-1 MG TABS    Sig: Take 1 tablet  by mouth daily before breakfast.    Dispense:  30 tablet    Refill:  11   Orders Placed This Encounter  Procedures  . HIV antibody  . Hepatitis B surface antigen  . RPR  . Hepatitis C antibody  . Comprehensive metabolic panel  . TSH  . CBC

## 2014-11-19 NOTE — Addendum Note (Signed)
Addended by: Marya Landry D on: 11/19/2014 05:16 PM   Modules accepted: Orders

## 2014-11-20 LAB — COMPREHENSIVE METABOLIC PANEL
ALK PHOS: 53 U/L (ref 33–115)
ALT: 25 U/L (ref 6–29)
AST: 23 U/L (ref 10–30)
Albumin: 4.4 g/dL (ref 3.6–5.1)
BILIRUBIN TOTAL: 0.3 mg/dL (ref 0.2–1.2)
BUN: 7 mg/dL (ref 7–25)
CO2: 24 meq/L (ref 20–31)
Calcium: 9.1 mg/dL (ref 8.6–10.2)
Chloride: 106 mEq/L (ref 98–110)
Creat: 0.72 mg/dL (ref 0.50–1.10)
GLUCOSE: 73 mg/dL (ref 65–99)
Potassium: 4.5 mEq/L (ref 3.5–5.3)
SODIUM: 139 meq/L (ref 135–146)
TOTAL PROTEIN: 7 g/dL (ref 6.1–8.1)

## 2014-11-20 LAB — HEPATITIS B SURFACE ANTIGEN: Hepatitis B Surface Ag: NEGATIVE

## 2014-11-20 LAB — CBC
HCT: 39.3 % (ref 36.0–46.0)
Hemoglobin: 12.7 g/dL (ref 12.0–15.0)
MCH: 28.7 pg (ref 26.0–34.0)
MCHC: 32.3 g/dL (ref 30.0–36.0)
MCV: 88.9 fL (ref 78.0–100.0)
MPV: 11.3 fL (ref 8.6–12.4)
PLATELETS: 208 10*3/uL (ref 150–400)
RBC: 4.42 MIL/uL (ref 3.87–5.11)
RDW: 13.7 % (ref 11.5–15.5)
WBC: 5.7 10*3/uL (ref 4.0–10.5)

## 2014-11-20 LAB — HIV ANTIBODY (ROUTINE TESTING W REFLEX): HIV 1&2 Ab, 4th Generation: NONREACTIVE

## 2014-11-20 LAB — GC/CHLAMYDIA PROBE AMP
CT PROBE, AMP APTIMA: NEGATIVE
GC PROBE AMP APTIMA: NEGATIVE

## 2014-11-20 LAB — HEPATITIS C ANTIBODY: HCV Ab: NEGATIVE

## 2014-11-20 LAB — RPR

## 2014-11-20 LAB — TSH: TSH: 0.818 u[IU]/mL (ref 0.350–4.500)

## 2015-01-06 ENCOUNTER — Ambulatory Visit (INDEPENDENT_AMBULATORY_CARE_PROVIDER_SITE_OTHER): Payer: Medicaid Other | Admitting: Obstetrics

## 2015-01-06 ENCOUNTER — Encounter: Payer: Self-pay | Admitting: Obstetrics

## 2015-01-06 VITALS — BP 107/74 | HR 84 | Temp 99.1°F | Wt 120.0 lb

## 2015-01-06 DIAGNOSIS — R102 Pelvic and perineal pain: Secondary | ICD-10-CM | POA: Diagnosis not present

## 2015-01-06 LAB — POCT URINALYSIS DIPSTICK
BILIRUBIN UA: NEGATIVE
Glucose, UA: NEGATIVE
LEUKOCYTES UA: NEGATIVE
NITRITE UA: NEGATIVE
PH UA: 5
PROTEIN UA: NEGATIVE
RBC UA: NEGATIVE
Spec Grav, UA: 1.02
UROBILINOGEN UA: NEGATIVE

## 2015-01-06 MED ORDER — OXYCODONE-ACETAMINOPHEN 5-325 MG PO TABS: 1.0000 | ORAL_TABLET | ORAL | Status: DC | PRN

## 2015-01-06 NOTE — Addendum Note (Signed)
Addended by: Marya Landry D on: 01/06/2015 02:52 PM   Modules accepted: Orders

## 2015-01-06 NOTE — Progress Notes (Addendum)
Patient ID: [name], female   DOB: 02-Jan-1981, 34 y.o.   MRN: [id number]  Chief Complaint  Patient presents with  . Pelvic Pain    right side lower pain, constant dull x 2days    HPI Elizabeth Boyd is a 34 y.o. female.  Lower right sided pain for last week.  Normal bowel activity, daily.  Had some N/V on this past Monday.  HPI  History reviewed. No pertinent past medical history.  History reviewed. No pertinent past surgical history.  Family History  Problem Relation Age of Onset  . Diabetes Paternal Grandmother     Social History Social History  Substance Use Topics  . Smoking status: Never Smoker   . Smokeless tobacco: None  . Alcohol Use: No    No Known Allergies  Current Outpatient Prescriptions  Medication Sig Dispense Refill  . Prenatal Vit-Fe Fumarate-FA (PNV PRENATAL PLUS MULTIVITAMIN) 27-1 MG TABS Take 1 tablet by mouth daily before breakfast. 30 tablet 11  . oxyCODONE-acetaminophen (ROXICET) 5-325 MG per tablet Take 1-2 tablets by mouth every 4 (four) hours as needed for moderate pain or severe pain. 40 tablet 0   No current facility-administered medications for this visit.    Review of Systems Review of Systems Constitutional: negative for fatigue and weight loss Respiratory: negative for cough and wheezing Cardiovascular: negative for chest pain, fatigue and palpitations Gastrointestinal: positive for abdominal pain and negative for change in bowel habits Genitourinary: positive RLQ pain Integument/breast: negative for nipple discharge Musculoskeletal:negative for myalgias Neurological: negative for gait problems and tremors Behavioral/Psych: negative for abusive relationship, depression Endocrine: negative for temperature intolerance     Blood pressure 107/74, pulse 84, temperature 99.1 F (37.3 C), weight 120 lb (54.432 kg), last menstrual period 12/30/2014.  Physical Exam Physical Exam            General:  Alert and no distress. Abdomen:   normal findings: no organomegaly, soft, non-tender and no hernia  Pelvis:  External genitalia: normal general appearance Urinary system: urethral meatus normal and bladder without fullness, nontender Vaginal: normal without tenderness, induration or masses Cervix: normal appearance Adnexa: normal bimanual exam Uterus: anteverted and non-tender, normal size      Data Reviewed Labs  Assessment     Abdominal - pelvic pain.  Probable GI bug.    Plan    Ultrasound ordered. F/U 2 weeks.  Orders Placed This Encounter  Procedures  . SureSwab, Vaginosis/Vaginitis Plus  . US Pelvis Complete    Standing Status: Future     Number of Occurrences:      Standing Expiration Date: 03/07/2016    Order Specific Question:  Reason for Exam (SYMPTOM  OR DIAGNOSIS REQUIRED)    Answer:  Pelvic pain    Order Specific Question:  Preferred imaging location?    Answer:  Internal  . US Transvaginal Non-OB    Standing Status: Future     Number of Occurrences:      Standing Expiration Date: 03/07/2016    Order Specific Question:  Reason for Exam (SYMPTOM  OR DIAGNOSIS REQUIRED)    Answer:  Pelvic pain    Order Specific Question:  Preferred imaging location?    Answer:  Internal   Meds ordered this encounter  Medications  . oxyCODONE-acetaminophen (ROXICET) 5-325 MG per tablet    Sig: Take 1-2 tablets by mouth every 4 (four) hours as needed for moderate pain or severe pain.    Dispense:  40 tablet    Refill:  0

## 2015-01-08 ENCOUNTER — Ambulatory Visit (INDEPENDENT_AMBULATORY_CARE_PROVIDER_SITE_OTHER): Payer: Medicaid Other

## 2015-01-08 ENCOUNTER — Ambulatory Visit (INDEPENDENT_AMBULATORY_CARE_PROVIDER_SITE_OTHER): Payer: Medicaid Other | Admitting: Obstetrics

## 2015-01-08 ENCOUNTER — Other Ambulatory Visit: Payer: Self-pay | Admitting: Obstetrics

## 2015-01-08 VITALS — BP 105/69 | HR 85 | Temp 99.0°F | Wt 119.8 lb

## 2015-01-08 DIAGNOSIS — R102 Pelvic and perineal pain: Secondary | ICD-10-CM

## 2015-01-10 LAB — SURESWAB, VAGINOSIS/VAGINITIS PLUS
ATOPOBIUM VAGINAE: 5.1 Log (cells/mL)
BV CATEGORY: UNDETERMINED — AB
C. TRACHOMATIS RNA, TMA: NOT DETECTED
C. albicans, DNA: NOT DETECTED
C. glabrata, DNA: NOT DETECTED
C. parapsilosis, DNA: NOT DETECTED
C. tropicalis, DNA: NOT DETECTED
GARDNERELLA VAGINALIS: 7.4 Log (cells/mL)
LACTOBACILLUS SPECIES: 6.7 Log (cells/mL)
MEGASPHAERA SPECIES: NOT DETECTED Log (cells/mL)
N. GONORRHOEAE RNA, TMA: NOT DETECTED
T. vaginalis RNA, QL TMA: NOT DETECTED

## 2015-01-11 ENCOUNTER — Other Ambulatory Visit: Payer: Self-pay | Admitting: Obstetrics

## 2015-01-11 DIAGNOSIS — N760 Acute vaginitis: Principal | ICD-10-CM

## 2015-01-11 DIAGNOSIS — B9689 Other specified bacterial agents as the cause of diseases classified elsewhere: Secondary | ICD-10-CM

## 2015-01-11 MED ORDER — METRONIDAZOLE 500 MG PO TABS: 500.0000 mg | ORAL_TABLET | Freq: Two times a day (BID) | ORAL | Status: DC

## 2015-01-12 DIAGNOSIS — R102 Pelvic and perineal pain: Secondary | ICD-10-CM | POA: Insufficient documentation

## 2015-01-12 NOTE — Progress Notes (Signed)
Patient not seen by physician. 

## 2015-06-23 ENCOUNTER — Encounter (HOSPITAL_COMMUNITY): Payer: Self-pay

## 2015-06-23 ENCOUNTER — Emergency Department (HOSPITAL_COMMUNITY)
Admission: EM | Admit: 2015-06-23 | Discharge: 2015-06-23 | Disposition: A | Discharge status: Other Acute Inpatient Hospital | Payer: Medicaid Other | Attending: Emergency Medicine | Admitting: Emergency Medicine

## 2015-06-23 ENCOUNTER — Inpatient Hospital Stay (HOSPITAL_COMMUNITY)
Admission: AD | Admit: 2015-06-23 | Discharge: 2015-06-30 | DRG: 885 | Disposition: A | Discharge status: Home / Self Care | Payer: Medicaid Other | Attending: Psychiatry | Admitting: Psychiatry

## 2015-06-23 DIAGNOSIS — E221 Hyperprolactinemia: Secondary | ICD-10-CM | POA: Diagnosis present

## 2015-06-23 DIAGNOSIS — R4182 Altered mental status, unspecified: Secondary | ICD-10-CM | POA: Diagnosis present

## 2015-06-23 DIAGNOSIS — G4700 Insomnia, unspecified: Secondary | ICD-10-CM | POA: Diagnosis present

## 2015-06-23 DIAGNOSIS — F250 Schizoaffective disorder, bipolar type: Secondary | ICD-10-CM | POA: Diagnosis not present

## 2015-06-23 DIAGNOSIS — F312 Bipolar disorder, current episode manic severe with psychotic features: Secondary | ICD-10-CM | POA: Diagnosis not present

## 2015-06-23 DIAGNOSIS — Z833 Family history of diabetes mellitus: Secondary | ICD-10-CM

## 2015-06-23 DIAGNOSIS — Z791 Long term (current) use of non-steroidal anti-inflammatories (NSAID): Secondary | ICD-10-CM | POA: Diagnosis not present

## 2015-06-23 DIAGNOSIS — Z3202 Encounter for pregnancy test, result negative: Secondary | ICD-10-CM | POA: Diagnosis not present

## 2015-06-23 DIAGNOSIS — L80 Vitiligo: Secondary | ICD-10-CM | POA: Diagnosis present

## 2015-06-23 DIAGNOSIS — F411 Generalized anxiety disorder: Secondary | ICD-10-CM | POA: Diagnosis present

## 2015-06-23 DIAGNOSIS — F1220 Cannabis dependence, uncomplicated: Secondary | ICD-10-CM | POA: Diagnosis present

## 2015-06-23 DIAGNOSIS — Z79899 Other long term (current) drug therapy: Secondary | ICD-10-CM

## 2015-06-23 DIAGNOSIS — F3160 Bipolar disorder, current episode mixed, unspecified: Secondary | ICD-10-CM | POA: Diagnosis not present

## 2015-06-23 DIAGNOSIS — F319 Bipolar disorder, unspecified: Secondary | ICD-10-CM | POA: Diagnosis present

## 2015-06-23 LAB — COMPREHENSIVE METABOLIC PANEL
ALT: 28 U/L (ref 14–54)
AST: 26 U/L (ref 15–41)
Albumin: 5 g/dL (ref 3.5–5.0)
Alkaline Phosphatase: 56 U/L (ref 38–126)
Anion gap: 15 (ref 5–15)
BILIRUBIN TOTAL: 1.6 mg/dL — AB (ref 0.3–1.2)
BUN: 10 mg/dL (ref 6–20)
CHLORIDE: 108 mmol/L (ref 101–111)
CO2: 21 mmol/L — ABNORMAL LOW (ref 22–32)
CREATININE: 0.97 mg/dL (ref 0.44–1.00)
Calcium: 9.9 mg/dL (ref 8.9–10.3)
Glucose, Bld: 83 mg/dL (ref 65–99)
POTASSIUM: 3.7 mmol/L (ref 3.5–5.1)
Sodium: 144 mmol/L (ref 135–145)
TOTAL PROTEIN: 8.4 g/dL — AB (ref 6.5–8.1)

## 2015-06-23 LAB — CBC WITH DIFFERENTIAL/PLATELET
BASOS ABS: 0 10*3/uL (ref 0.0–0.1)
BASOS PCT: 0 %
EOS PCT: 2 %
Eosinophils Absolute: 0.1 10*3/uL (ref 0.0–0.7)
HCT: 40.7 % (ref 36.0–46.0)
Hemoglobin: 13.8 g/dL (ref 12.0–15.0)
Lymphocytes Relative: 27 %
Lymphs Abs: 1.9 10*3/uL (ref 0.7–4.0)
MCH: 30 pg (ref 26.0–34.0)
MCHC: 33.9 g/dL (ref 30.0–36.0)
MCV: 88.5 fL (ref 78.0–100.0)
MONOS PCT: 7 %
Monocytes Absolute: 0.5 10*3/uL (ref 0.1–1.0)
NEUTROS PCT: 64 %
Neutro Abs: 4.4 10*3/uL (ref 1.7–7.7)
PLATELETS: 195 10*3/uL (ref 150–400)
RBC: 4.6 MIL/uL (ref 3.87–5.11)
RDW: 12.6 % (ref 11.5–15.5)
WBC: 6.9 10*3/uL (ref 4.0–10.5)

## 2015-06-23 LAB — ETHANOL

## 2015-06-23 LAB — I-STAT BETA HCG BLOOD, ED (MC, WL, AP ONLY)

## 2015-06-23 LAB — ACETAMINOPHEN LEVEL

## 2015-06-23 LAB — SALICYLATE LEVEL: Salicylate Lvl: <4 mg/dL (ref 2.8–30.0)

## 2015-06-23 MED ORDER — MAGNESIUM HYDROXIDE 400 MG/5ML PO SUSP: 30.0000 mL | Freq: Every day | ORAL | Status: DC | PRN

## 2015-06-23 MED ORDER — ACETAMINOPHEN 325 MG PO TABS
650.0000 mg | ORAL_TABLET | Freq: Four times a day (QID) | ORAL | Status: DC | PRN
  Administered 2015-06-25 – 2015-06-26 (×2): 650 mg via ORAL
  Filled 2015-06-23 (×3): qty 2

## 2015-06-23 MED ORDER — ALUM & MAG HYDROXIDE-SIMETH 200-200-20 MG/5ML PO SUSP: 30.0000 mL | ORAL | Status: DC | PRN

## 2015-06-23 MED ORDER — QUETIAPINE FUMARATE 100 MG PO TABS
100.0000 mg | ORAL_TABLET | Freq: Every evening | ORAL | Status: DC | PRN
  Administered 2015-06-23 (×2): 100 mg via ORAL
  Filled 2015-06-23 (×6): qty 1

## 2015-06-23 MED ORDER — HYDROXYZINE HCL 25 MG PO TABS
25.0000 mg | ORAL_TABLET | Freq: Four times a day (QID) | ORAL | Status: DC | PRN
  Administered 2015-06-23 – 2015-06-25 (×3): 25 mg via ORAL
  Filled 2015-06-23 (×3): qty 1

## 2015-06-23 NOTE — Progress Notes (Signed)
Did not attend group 

## 2015-06-23 NOTE — Tx Team (Signed)
Initial Interdisciplinary Treatment Plan   PATIENT STRESSORS: Marital or family conflict Substance abuse   PATIENT STRENGTHS: Physical Health Supportive family/friends   PROBLEM LIST: Problem List/Patient Goals Date to be addressed Date deferred Reason deferred Estimated date of resolution  Anxiety 06/23/15     Depression 06/23/15     "I want peace and quite." 06/23/15     "Go home." 06/23/15                                    DISCHARGE CRITERIA:  Ability to meet basic life and health needs Improved stabilization in mood, thinking, and/or behavior Medical problems require only outpatient monitoring  PRELIMINARY DISCHARGE PLAN: Attend aftercare/continuing care group Attend PHP/IOP Attend 12-step recovery group Outpatient therapy  PATIENT/FAMIILY INVOLVEMENT: This treatment plan has been presented to and reviewed with the patient, Elizabeth Boyd, and/or family member.  The patient and family have been given the opportunity to ask questions and make suggestions.  Bethann Punches 06/23/2015, 6:08 PM

## 2015-06-23 NOTE — ED Notes (Signed)
Pt is not sure why she is here. She described being up early at 5 am and her significant (x-spouse) worried about her. She denies SI/HI. Unable to determine if she is delusional or having hallucinations. The content of her speech is tangential as she digresses and cannot stay organized enough to answer a question directly. She is pleasant and cooperative.

## 2015-06-23 NOTE — ED Notes (Signed)
Husband states that for the last three days the patient has been speaking with a different accent, not sleeping and being aggressive

## 2015-06-23 NOTE — ED Notes (Signed)
Pt transported by [facility] by police. NAD.

## 2015-06-23 NOTE — ED Notes (Signed)
Pt reported that she wanted to walk outside and smoke a cigarette. Informed pt that she was IVC and not able to leave the facility. Pt clinging to her purse and wants to speak with security. Security, [employer], and MD outside of room. MD Adriana Simas spoke with pt and again explained the process to pt. Pt handed her purse over to this RN. Continues to talk with a flight of ideas.

## 2015-06-23 NOTE — BH Assessment (Signed)
[facility] Assessment Progress Note  Per Nanine Means, NP, this pt requires psychiatric hospitalization at this time.  Berneice Heinrich, RN, [facility] has assigned pt to [facility] Rm 507-1.  Pt is under IVC, and IVC documents have been faxed to [facility].  Pt's nurse, Diane, has been notified, and agrees to call report to [phone].  Pt is to be transported via [employer].  Doylene Canning, MA Triage Specialist [phone]

## 2015-06-23 NOTE — Progress Notes (Signed)
D: Patient alert and oriented x 4. At the time of assessment patient denies pain/SI/HI/AVH. Patient was having flight of ideas. This writer was having to redirect patient back to assessment questions. Patient continued to talk about Friday and she was feeling well.  At 2140 patient started yelling in room, and talking like she was talking to someone in the room. When this writer went to room to ask patient some questions, patient started acting aggressive to Clinical research associate, stating in an african dialect, "What you going to do white bitch, you ain't nobody white bitch." This writer left room and patient slammed the door.  A: Staff to monitor Q 15 mins for safety. Encouragement and support offered. Scheduled medications administered per orders. R: Patient remains safe on the unit. Patient attended group tonight. Patient visible on the unit. Patient taking administered medications.

## 2015-06-23 NOTE — Progress Notes (Signed)
Entered in d/c instructions Medicine, [facility] Schedule an appointment as soon as possible for a visit As needed This is the assigned medicaid doctor listed for you if prefer another please contact DSs [phone] [address] [place] [address] [phone] medicaid coverage please use this site to assist with finding out about your coverage or renewal of application [email or web address] You have access to transportation services by calling DSS [phone]  It is your responsibility to update DSS whenever you change addresses, go from county to county or state to state to maintain your coverage

## 2015-06-23 NOTE — ED Provider Notes (Signed)
CSN: [id number]     Arrival date & time 06/23/15  4098 History   First MD Initiated Contact with Patient 06/23/15 [phone]     Chief Complaint  Patient presents with  . Altered Mental Status    HPI  Elizabeth Boyd is an 35 y.o. female with no significant PMH who presents to the ED for evaluation of AMS. She is accompanied by her husband who provides some of her history. Per pt's husband, pt has not slept in 3 days. He reports that pt is speaking with an accent that is different than normal. Pt has reportedly been intermittently aggressive at home. She has also had some periods of confusion, though he is not able to clarify specifically how she has been confused. In the ED pt has labile mood, at times pleasant and cooperative and suddenly defensive/agitated. She is a&o to person/place/time. She states she is not sure why she is here, but her family wanted her to come. She states she does not feel sick, has no pain, has no complaints. Pt does have some tangential thought and at times what she is saying does not make sense in the context of our conversation. She is speaking with an accent, which her husband states is not her normal accent. She denies SI/HI, AH/VH. Pt's husband reports something similar has happened with pt once before about ten years ago. He states pt is not taking any medications, denies new medications. Denies etoh or drug use.   History reviewed. No pertinent past medical history. History reviewed. No pertinent past surgical history. Family History  Problem Relation Age of Onset  . Diabetes Paternal Grandmother    Social History  Substance Use Topics  . Smoking status: Never Smoker   . Smokeless tobacco: None  . Alcohol Use: No   OB History    Gravida Para Term Preterm AB TAB SAB Ectopic Multiple Living   Review of Systems  All other systems reviewed and are negative.     Allergies  Review of patient's allergies indicates no known  allergies.  Home Medications   Prior to Admission medications   Medication Sig Start Date End Date Taking? Authorizing Provider  metroNIDAZOLE (FLAGYL) 500 MG tablet Take 1 tablet (500 mg total) by mouth 2 (two) times daily. 01/11/15   Brock Bad, MD  oxyCODONE-acetaminophen (ROXICET) 5-325 MG per tablet Take 1-2 tablets by mouth every 4 (four) hours as needed for moderate pain or severe pain. 01/06/15   Brock Bad, MD  Prenatal Vit-Fe Fumarate-FA (PNV PRENATAL PLUS MULTIVITAMIN) 27-1 MG TABS Take 1 tablet by mouth daily before breakfast. 11/19/14   Brock Bad, MD   BP 129/104 mmHg  Pulse 93  Temp(Src) 98.2 F (36.8 C) (Oral)  Ht  (1.6 m)  Wt 54.432 kg  BMI 21.26 kg/m2  SpO2 100% Physical Exam  Constitutional: She is oriented to person, place, and time. No distress.  HENT:  Head: Atraumatic.  Right Ear: External ear normal.  Left Ear: External ear normal.  Nose: Nose normal.  Mouth/Throat: Oropharynx is clear and moist.  Eyes: Conjunctivae are normal. Pupils are equal, round, and reactive to light. No scleral icterus.  Neck: Normal range of motion. Neck supple.  Cardiovascular: Normal rate, [id number] and normal heart sounds.   Pulmonary/Chest: Effort normal and breath sounds normal. No respiratory distress. She has no wheezes. She exhibits no tenderness.  Abdominal: Soft.  Bowel sounds are normal. She exhibits no distension. There is no tenderness.  Neurological: She is alert and oriented to person, place, and time. No cranial nerve deficit. Coordination and gait normal.  Skin: Skin is warm and dry. She is not diaphoretic.  Psychiatric: Her behavior is normal. Her affect is labile.  Nursing note and vitals reviewed.   ED Course  Procedures (including critical care time) Labs Review Labs Reviewed  COMPREHENSIVE METABOLIC PANEL - Abnormal; Notable for the following:    CO2 21 (*)    Total Protein 8.4 (*)    Total Bilirubin 1.6 (*)    All other  components within normal limits  ACETAMINOPHEN LEVEL - Abnormal; Notable for the following:    Acetaminophen (Tylenol), Serum <10 (*)    All other components within normal limits  ETHANOL  SALICYLATE LEVEL  CBC WITH DIFFERENTIAL/PLATELET  URINE RAPID DRUG SCREEN, HOSP PERFORMED  URINALYSIS, ROUTINE W REFLEX MICROSCOPIC (NOT AT [facility])  I-STAT BETA HCG BLOOD, ED ([facility], [facility], [facility] ONLY)    Imaging Review No results found. I have personally reviewed and evaluated these images and lab results as part of my medical decision-making.   EKG Interpretation None      MDM   Final diagnoses:  None    Pt is an otherwise healthy 35 y.o. female, here voluntarily, for evaluation of AMS. Given her husband's report I suspect manic episode with psychosis. Will medically clear and pending clearance will consult TTS for psych eval.    As labs are resulting pt would like to leave. She is agitated. Her speech is clear though she continues to have some tangential speech and nonsensical speech. Her husband fears she is a danger to herself and to her family. He would like to IVC.  IVC in place. Labs unremarkable and pt is medically clear. Pt placed in IVC hold and TTS eval is pending.   Carlene Coria, PA-C 06/23/15 1031  Gilda Crease, MD 06/30/15 [phone]

## 2015-06-23 NOTE — BH Assessment (Signed)
Per Catha Nottingham, DNP - patient meets criteria for inpatient hospitalization.  TTS will seek placement.  Writer informed the [facility] Inetta Fermo).

## 2015-06-23 NOTE — ED Notes (Signed)
Report given to psych ED. 

## 2015-06-23 NOTE — ED Notes (Signed)
Pt wanded by security. 

## 2015-06-23 NOTE — Progress Notes (Signed)
This is 35 yr old female who [facility] husband with the complain of talking with African accent, flight of ideas and aggression. During admission pt was cooperative, but hyper talkative. Pt was alert and oriented x 3, was able to answer all the questions. Consents signed, skin/belongings search completed and pt oriented to unit. Pt stable at this time. Pt given the opportunity to express concerns and ask questions. Pt given toiletries. Pt's safety ensured with 15 minute and environmental checks. Pt currently denies SI/HI and A/V hallucinations. Pt verbally agrees to seek staff if SI/HI or A/VH occurs and to consult with staff before acting on these thoughts. Will continue POC.

## 2015-06-23 NOTE — BH Assessment (Addendum)
Tele Assessment Note   Elizabeth Boyd is a 35 year old [place] American female that was brought to the ED by her husband because she started speaking with an African accident.  Patient has flight of ideas and has to be redirected several times throughout the assessment.  Patient is a poor historian.  Patient denies SI/HI/Psychosis/Substance Abuse.  Patient denies physical, sexual and emotional abuse.     Collateral information from the patient's mother and husband Elizabeth Boyd [phone]) reports that the patient was born in the [place] and has never been out of the country.  Her husband reports that she has not slept for three days.  During the assessment the patient does have some tangential thought and at times what she is saying does not make sense in the context of our conversation.   Collateral information from the patient's mother Elizabeth Boyd [phone]) reports that she was aggressive at home and had to be restrained by her mother and father.  Per her mother the patient was, "not making sense and yelling".  Patient was displaying confused, erratic and manic behaviors, per her mother.    Documentation in the epic chart reports that the patient was hospitalized in May 2012 at [facility] for psychosis.  Patient has a diagnosis of Schizophrenia.  Patient has not been taking her medication for years.   Patient lives with her mother, three daughters and one son.  Patient is estranged from her husband.  Patient husband is African and the patient is speaking with the same type of accent as her husband.     Per Catha Nottingham, DNP - patient meets criteria for inpatient hospitalization.  TTS will seek placement.  Writer informed the [facility] Inetta Fermo).    Diagnosis: Mood Disorder   Past Medical History: History reviewed. No pertinent past medical history.  History reviewed. No pertinent past surgical history.  Family History:  Family History  Problem Relation Age of Onset  . Diabetes Paternal Grandmother      Social History:  reports that she has never smoked. She does not have any smokeless tobacco history on file. She reports that she does not drink alcohol or use illicit drugs.  Additional Social History:  Alcohol / Drug Use History of alcohol / drug use?: No history of alcohol / drug abuse  CIWA: CIWA-Ar BP: 128/92 mmHg Pulse Rate: 67 COWS:    PATIENT STRENGTHS: (choose at least two) Average or above average intelligence Physical Health Supportive family/friends  Allergies: No Known Allergies  Home Medications:  (Not in a hospital admission)  OB/GYN Status:  No LMP recorded.  General Assessment Data [facility] of Assessment: [facility] ED TTS Assessment: In system Is this a Tele or Face-to-Face Assessment?: Tele Assessment Is this an Initial Assessment or a Re-assessment for this encounter?: Initial Assessment Marital status: Married Is patient pregnant?: No Pregnancy Status: No Living Arrangements: Other (Comment) (Lives with her mother and children) Can pt return to current living arrangement?: Yes Admission Status: Voluntary Is patient capable of signing voluntary admission?: Yes Referral Source: Self/Family/Friend Insurance type: Medicaid     Crisis Care Plan Living Arrangements: Other (Comment) (Lives with her mother and children) Legal Guardian:  (NA) Name of Psychiatrist: None Reported Name of Therapist: None Reported  Education Status Is patient currently in school?: No Current Grade: NA Highest grade of school patient has completed: NA Name of school: NA Contact person: NA  Risk to self with the past 6 months Suicidal Ideation: No Has patient been a risk to self within the  past 6 months prior to admission? : No Suicidal Intent: No Has patient had any suicidal intent within the past 6 months prior to admission? : No Is patient at risk for suicide?: No Suicidal Plan?: No Has patient had any suicidal plan within the past 6 months prior to admission? :  No Access to Means: No What has been your use of drugs/alcohol within the last 12 months?: None Reported Previous Attempts/Gestures: Yes How many times?: 0 Other Self Harm Risks: None Reported Triggers for Past Attempts:  (None Reported) Intentional Self Injurious Behavior: None Family Suicide History: Yes Recent stressful life event(s): Divorce Persecutory voices/beliefs?: No Depression: Yes Depression Symptoms: Tearfulness, Despondent, Loss of interest in usual pleasures Substance abuse history and/or treatment for substance abuse?: No Suicide prevention information given to non-admitted patients: Not applicable  Risk to Others within the past 6 months Homicidal Ideation: No Does patient have any lifetime risk of violence toward others beyond the six months prior to admission? : No Thoughts of Harm to Others: No Current Homicidal Intent: No Current Homicidal Plan: No Access to Homicidal Means: No Identified Victim: None History of harm to others?: No Assessment of Violence: None Noted Violent Behavior Description: None Reported Does patient have access to weapons?: No Criminal Charges Pending?: No Does patient have a court date: No Is patient on probation?: No  Psychosis Hallucinations: None noted Delusions: Grandiose (Talks with a [place] accent)  Mental Status Report Appearance/Hygiene: Disheveled Eye Contact: Good Motor Activity: Freedom of movement, Restlessness Speech: Loud, Tangential Level of Consciousness: Alert, Restless, Irritable Mood: Depressed, Anxious, Suspicious, Worthless, low self-esteem Affect: Anxious, Blunted, Depressed Anxiety Level: Minimal Thought Processes: Flight of Ideas, Tangential Judgement: Unable to Assess Orientation: Person, Place, Time, Situation Obsessive Compulsive Thoughts/Behaviors: None  Cognitive Functioning Concentration: Decreased Memory: Unable to Assess IQ: Average Insight: Poor Impulse Control: Poor Appetite:  Fair Weight Loss: 0 Weight Gain: 0 Sleep: Decreased Total Hours of Sleep: 4 Vegetative Symptoms: Decreased grooming, Not bathing, Staying in bed  ADLScreening [facility] Assessment Services) Patient's cognitive ability adequate to safely complete daily activities?: No Patient able to express need for assistance with ADLs?: Yes Independently performs ADLs?: Yes (appropriate for developmental age)  Prior Inpatient Therapy Prior Inpatient Therapy: Yes Prior Therapy Dates: 2005 Prior Therapy Facilty/Provider(s): [facility] Reason for Treatment: Patient was unable to remember  Prior Outpatient Therapy Prior Outpatient Therapy: Yes Prior Therapy Dates: Unable to remember  Prior Therapy Facilty/Provider(s): Unable to remember the Psychiatrist name Reason for Treatment: Medication Management and OPT Does patient have an ACCT team?: No Does patient have Intensive In-House Services?  : No Does patient have [facility] services? : No Does patient have P4CC services?: No  ADL Screening (condition at time of admission) Patient's cognitive ability adequate to safely complete daily activities?: No Is the patient deaf or have difficulty hearing?: No Does the patient have difficulty seeing, even when wearing glasses/contacts?: No Does the patient have difficulty concentrating, remembering, or making decisions?: Yes Patient able to express need for assistance with ADLs?: Yes Does the patient have difficulty dressing or bathing?: No Independently performs ADLs?: Yes (appropriate for developmental age) Does the patient have difficulty walking or climbing stairs?: No Weakness of Legs: None Weakness of Arms/Hands: None  Home Assistive Devices/Equipment Home Assistive Devices/Equipment: None    Abuse/Neglect Assessment (Assessment to be complete while patient is alone) Physical Abuse: Denies Verbal Abuse: Denies Sexual Abuse: Denies Exploitation of patient/patient's resources: Denies Self-Neglect:  Denies Values / Beliefs Cultural Requests During Hospitalization: None Spiritual Requests  During Hospitalization: None Consults Spiritual Care Consult Needed: No Social Work Consult Needed: No Merchant navy officer (For Healthcare) Does patient have an advance directive?: No Would patient like information on creating an advanced directive?: No - patient declined information    Additional Information 1:1 In Past 12 Months?: No CIRT Risk: No Elopement Risk: No Does patient have medical clearance?: Yes     Disposition: Per Catha Nottingham, DNP - patient meets criteria for inpatient hospitalization.  TTS will seek placement.  Writer informed the [facility] Inetta Fermo).   Disposition Initial Assessment Completed for this Encounter: Yes  Linton Rump 06/23/2015 9:52 AM

## 2015-06-24 ENCOUNTER — Encounter (HOSPITAL_COMMUNITY): Payer: Self-pay | Admitting: Psychiatry

## 2015-06-24 DIAGNOSIS — L80 Vitiligo: Secondary | ICD-10-CM | POA: Insufficient documentation

## 2015-06-24 DIAGNOSIS — F3160 Bipolar disorder, current episode mixed, unspecified: Secondary | ICD-10-CM

## 2015-06-24 DIAGNOSIS — F319 Bipolar disorder, unspecified: Secondary | ICD-10-CM | POA: Diagnosis present

## 2015-06-24 DIAGNOSIS — F1220 Cannabis dependence, uncomplicated: Secondary | ICD-10-CM | POA: Diagnosis present

## 2015-06-24 MED ORDER — OLANZAPINE 10 MG IM SOLR: 5.0000 mg | Freq: Three times a day (TID) | INTRAMUSCULAR | Status: DC | PRN

## 2015-06-24 MED ORDER — OLANZAPINE 5 MG PO TBDP
5.0000 mg | ORAL_TABLET | Freq: Three times a day (TID) | ORAL | Status: DC | PRN
  Administered 2015-06-24: 5 mg via ORAL

## 2015-06-24 MED ORDER — OLANZAPINE 2.5 MG PO TABS
2.5000 mg | ORAL_TABLET | Freq: Every day | ORAL | Status: DC
  Administered 2015-06-25: 2.5 mg via ORAL
  Filled 2015-06-24 (×3): qty 1

## 2015-06-24 MED ORDER — OLANZAPINE 5 MG PO TBDP
5.0000 mg | ORAL_TABLET | Freq: Every day | ORAL | Status: DC
  Administered 2015-06-24 – 2015-06-25 (×2): 5 mg via ORAL
  Filled 2015-06-24 (×3): qty 1

## 2015-06-24 MED ORDER — OLANZAPINE 5 MG PO TBDP
2.5000 mg | ORAL_TABLET | Freq: Every day | ORAL | Status: DC
  Filled 2015-06-24: qty 0.5

## 2015-06-24 MED ORDER — LORAZEPAM 1 MG PO TABS
1.0000 mg | ORAL_TABLET | Freq: Four times a day (QID) | ORAL | Status: DC | PRN
  Administered 2015-06-24 – 2015-06-25 (×2): 1 mg via ORAL
  Filled 2015-06-24 (×2): qty 1

## 2015-06-24 MED ORDER — OLANZAPINE 5 MG PO TBDP: 5.0000 mg | ORAL_TABLET | Freq: Three times a day (TID) | ORAL | Status: DC | PRN

## 2015-06-24 MED ORDER — LAMOTRIGINE 25 MG PO TABS
25.0000 mg | ORAL_TABLET | Freq: Every day | ORAL | Status: DC
  Administered 2015-06-25 – 2015-06-28 (×4): 25 mg via ORAL
  Filled 2015-06-24 (×6): qty 1

## 2015-06-24 MED ORDER — LORAZEPAM 2 MG/ML IJ SOLN: 1.0000 mg | Freq: Four times a day (QID) | INTRAMUSCULAR | Status: DC | PRN

## 2015-06-24 MED ORDER — OLANZAPINE 5 MG PO TABS
5.0000 mg | ORAL_TABLET | Freq: Three times a day (TID) | ORAL | Status: DC | PRN
  Filled 2015-06-24: qty 2

## 2015-06-24 NOTE — [facility] Suicide Risk Assessment (Signed)
[facility] INPATIENT:  Family/Significant Other Suicide Prevention Education  Suicide Prevention Education:  Education Completed; Hemadou Issaka, Pt's husband, has been identified by the patient as the family member/significant other with whom the patient will be residing, and identified as the person(s) who will aid the patient in the event of a mental health crisis (suicidal ideations/suicide attempt).  With written consent from the patient, the family member/significant other has been provided the following suicide prevention education, prior to the and/or following the discharge of the patient.   Pt was not suicidal at time of admission and continues to deny while hospitalized, so SPE is not needed. However, CSW did contact Pt's husband to discuss crisis planning and obtain collateral information. Pt husband reports that Pt has not been sleeping and that her behavior has become aggressive along with other strange behaviors.   Chad Cordial M 06/24/2015, 1:32 PM

## 2015-06-24 NOTE — BHH Suicide Risk Assessment (Signed)
[facility] Admission Suicide Risk Assessment   Nursing information obtained from:    Demographic factors:    Current Mental Status:    Loss Factors:    Historical Factors:    Risk Reduction Factors:     Total Time spent with patient: 30 minutes Principal Problem: Bipolar disorder (HCC) Diagnosis:   Patient Active Problem List   Diagnosis Date Noted  . Bipolar disorder (HCC) [F31.9] 06/24/2015  . Cannabis use disorder, severe, dependence (HCC) [F12.20] 06/24/2015  . Vitiligo [L80] 06/24/2015  . Pelvic pain in female [R10.2] 01/12/2015   Subjective Data: Please see H&P.   Continued Clinical Symptoms:  Alcohol Use Disorder Identification Test Final Score (AUDIT): 0 The "Alcohol Use Disorders Identification Test", Guidelines for Use in Primary Care, Second Edition.  World Science writer [facility]). Score between 0-7:  no or low risk or alcohol related problems. Score between 8-15:  moderate risk of alcohol related problems. Score between 16-19:  high risk of alcohol related problems. Score 20 or above:  warrants further diagnostic evaluation for alcohol dependence and treatment.   CLINICAL FACTORS:   Alcohol/Substance Abuse/Dependencies Previous Psychiatric Diagnoses and Treatments   Musculoskeletal: Strength & Muscle Tone: within normal limits Gait & Station: normal Patient leans: N/A  Psychiatric Specialty Exam: Review of Systems  Unable to perform ROS: mental acuity    Blood pressure 117/92, pulse 75, temperature 98.5 F (36.9 C), temperature source Oral, resp. rate 18, height  (1.6 m), weight 54.432 kg (120 lb), last menstrual period 06/23/2015, SpO2 100 %.Body mass index is 21.26 kg/(m^2).                        Please see H&P.                                 COGNITIVE FEATURES THAT CONTRIBUTE TO RISK:  Closed-mindedness, Polarized thinking and Thought constriction (tunnel vision)    SUICIDE RISK:   Mild:  Suicidal ideation of limited  frequency, intensity, duration, and specificity.  There are no identifiable plans, no associated intent, mild dysphoria and related symptoms, good self-control (both objective and subjective assessment), few other risk factors, and identifiable protective factors, including available and accessible social support.  PLAN OF CARE: Please see H&P.   I certify that inpatient services furnished can reasonably be expected to improve the patient's condition.   Lekita Kerekes, MD 06/24/2015, 11:48 AM

## 2015-06-24 NOTE — Progress Notes (Signed)
At noon time, pt was hostile in the dayroom, twirling around and verbally aggressive. Pt was observed at that time, calling others "bitches" and repeatedly used profanity. Writer unable to deescalate pt at that time. Pt then stated, "this bitch ass nurse all up in my business, she don't know me". Pt continued to be threatening and aggressive, pt stated, "you don't want to see me". Pt has also been on the telephone cursing all morning at her husband, asking why he IVC'd her. When writer asked pt to try to refrain from using profanity, pt stated, "fuck you". Pt refused meds at that time. After speaking with Dr. Jama Flavors, pt agreed to take Zyprexa. Pt continued to be verbally aggressive and threatening after taking meds. Dr. Elna Breslow and Inetta Fermo, [facility] aware of pt inappropriate behaviors.
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
## 2015-06-24 NOTE — Progress Notes (Signed)
Pt refused lunch when offered by Simonne Come, MHT.

## 2015-06-24 NOTE — Progress Notes (Signed)
Pt approached the nurses station asking for pain meds. Pt stated "burst I need pain meds". Pt refused to called Clinical research associate by name when asked to and stated, "NO BITCH, YOU GOING TO GIVE ME MEDS". Writer asked pt not to use profanity and to be respectful. Pt then stated, "BITCH FUCK YOU". Pt then walked off down the hallway.

## 2015-06-24 NOTE — BHH Group Notes (Signed)
[facility] LCSW Aftercare Discharge Planning Group Note  06/24/2015 8:45 AM  Pt did not attend, declined invitation.   Chad Cordial, LCSWA 06/24/2015 1:35 PM

## 2015-06-24 NOTE — Progress Notes (Signed)
Pt refuses EKG. Pt refuses to take specimen cup for urine sample. Writer will report off to oncoming nurse to obtain EKG and urine sample.

## 2015-06-24 NOTE — H&P (Signed)
Psychiatric Admission Assessment Adult  Patient Identification: Elizabeth Boyd MRN:  [id number] Date of Evaluation:  06/24/2015 Chief Complaint:Patient states " I am fine , I am not sure why I am here.'   Principal Diagnosis: Bipolar disorder ([place]) R/O Bipolar type I mixed episode , severe with out psychosis versus substance (cannabis) induced bipolar and related disorder with onset during intoxication                                          H/O Schizoaffective disorder  Diagnosis:   Patient Active Problem List   Diagnosis Date Noted  . Bipolar disorder ([place]) [F31.9] 06/24/2015  . Cannabis use disorder, severe, dependence ([place]) [F12.20] 06/24/2015  . Vitiligo [L80] 06/24/2015  . Pelvic pain in female [R10.2] 01/12/2015   History of Present Illness:: Elizabeth Boyd is a 35 year old [place] American female, who is married , lives in [place] , with her parents and 4 children , is unemployed , has a past hx of schizoaffective disorder , cannabis abuse ( which she denies ) , was brought to the ED by her husband for disorganized behavior at home.  Per EHR " Pt started speaking with an African accent . Patient had flight of ideas and had to be redirected several times throughout the assessment. Patient was a poor historian. Patient denied SI/HI/Psychosis/Substance Abuse. Patient denies physical, sexual and emotional abuse. Collateral information from the patient's mother and husband Elizabeth Boyd [phone]) reports that the patient was born in the [place] and has never been out of the country. Her husband reports that she has not slept for three days. During the assessment the patient does have some tangential thought and at times what she is saying does not make sense in the context of our conversation. Collateral information from the patient's mother Elizabeth Boyd [phone]) reports that she was aggressive at home and had to be restrained by her mother and father. Per her mother the patient  was, "not making sense and yelling". Patient was displaying confused, erratic and manic behaviors, per her mother. "   Patient seen and chart reviewed today . Pt discussed with treatment team. Pt today initially appeared to be calm , denied all symptoms , denied any past hx of substance abuse , psychiatric diagnosis or hospitalizations. Pt reported that she did not know why her husband would bring her to the hospital. Pt gave consent to talk to husband for collateral information. Writer called husband on his phone - #noted above and put him on speaker phone. Per husband pt was acting erratic since Thursday evening. Pt has not slept in three days and has been making no sense when she speaks . Per him patient abuses cannabis daily and the last time she smoked was on Thursday. He had to go to work in [place] on Thursday , but was contacted by family who stated that pt was acting bizarre. Pt has filed for divorce against him , but he reports that it was patient's decision.  After writer spoke to husband , pt became very agitated . Pt continued to deny abusing cannabis and reported her husband was lying . She did report she was hospitalized twice in the past - at [facility] and also at [facility] . She wanted to know what her treatment plan was and this was discussed with pt . Pt initially agreed to take PO medications , however later  on was seen as yelling , being disruptive in the hallway , using profanity and asking for discharge.    Associated Signs/Symptoms: Depression Symptoms:  insomnia, anxiety, (Hypo) Manic Symptoms:  Distractibility, Impulsivity, Irritable Mood, Labiality of Mood, Anxiety Symptoms:  anxiety unspecified Psychotic Symptoms:  denies PTSD Symptoms: Negative Total Time spent with patient: 1 hour  Past Psychiatric History:Patient was admitted at [facility] in the past in 2005 for schizoaffective do, cannabis abuse. Pt also was admitted at [facility] in 2004. Pt denies past hx of suicide attempts. Pt  was referred to [facility] center in the past, but was noncompliant.  Is the patient at risk to self? Yes.    Has the patient been a risk to self in the past 6 months? Yes.    Has the patient been a risk to self within the distant past? Yes.    Is the patient a risk to others? Yes.    Has the patient been a risk to others in the past 6 months? Yes.    Has the patient been a risk to others within the distant past? Yes.      Alcohol Screening: Patient refused Alcohol Screening Tool: Yes 1. How often do you have a drink containing alcohol?: Never 2. How many drinks containing alcohol do you have on a typical day when you are drinking?: 1 or 2 3. How often do you have six or more drinks on one occasion?: Never Preliminary Score: 0 4. How often during the last year have you found that you were not able to stop drinking once you had started?: Never 5. How often during the last year have you failed to do what was normally expected from you becasue of drinking?: Never 6. How often during the last year have you needed a first drink in the morning to get yourself going after a heavy drinking session?: Never 7. How often during the last year have you had a feeling of guilt of remorse after drinking?: Never 8. How often during the last year have you been unable to remember what happened the night before because you had been drinking?: Never 9. Have you or someone else been injured as a result of your drinking?: No 10. Has a relative or friend or a doctor or another health worker been concerned about your drinking or suggested you cut down?: No Alcohol Use Disorder Identification Test Final Score (AUDIT): 0 Brief Intervention: AUDIT score less than 7 or less-screening does not suggest unhealthy drinking-brief intervention not indicated Substance Abuse History in the last 12 months:  Yes.   Consequences of Substance Abuse: several admission Previous Psychotropic Medications: yes - risperidone, zyprexa,  depakote Psychological Evaluations: No  Past Medical History:Vitiligo Family History:  Family History  Problem Relation Age of Onset  . Diabetes Paternal Grandmother    Family Psychiatric  History: Pt denies hx of mental illness, substance abuse, suicide in family. Tobacco Screening:states she does not smoke  Social History: Pt is married , lives in [place] with her parents , 4 children aged 40,7,9,12. Pt reports her husband works in [place] and comes and goes . She has filed for divorce against her husband. Pt is unemployed , states her family helps out.Reports she graduated college. History  Alcohol Use No     History  Drug Use  . Yes  . Special: Marijuana    Additional Social History: Marital status: Married Number of Years Married: 10 What types of issues is patient dealing with in the relationship?: husband  has always lived in [place]; comes to [place] occassionally Does patient have children?: Yes How many children?: 4 How is patient's relationship with their children?: great relationship    Pain Medications: see mar Prescriptions: see mar Over the Counter: see mar History of alcohol / drug use?: No history of alcohol / drug abuse                    Allergies:  No Known Allergies Lab Results:  Results for orders placed or performed during the hospital encounter of 06/23/15 (from the past 48 hour(s))  Comprehensive metabolic panel     Status: Abnormal   Collection Time: 06/23/15  7:36 AM  Result Value Ref Range   Sodium 144 135 - 145 mmol/L   Potassium 3.7 3.5 - 5.1 mmol/L   Chloride 108 101 - 111 mmol/L   CO2 21 (L) 22 - 32 mmol/L   Glucose, Bld 83 65 - 99 mg/dL   BUN 10 6 - 20 mg/dL   Creatinine, Ser 0.97 0.44 - 1.00 mg/dL   Calcium 9.9 8.9 - 10.3 mg/dL   Total Protein 8.4 (H) 6.5 - 8.1 g/dL   Albumin 5.0 3.5 - 5.0 g/dL   AST 26 15 - 41 U/L   ALT 28 14 - 54 U/L   Alkaline Phosphatase 56 38 - 126 U/L   Total Bilirubin 1.6 (H) 0.3 - 1.2 mg/dL   GFR calc non Af Amer >60  >60 mL/min   GFR calc Af Amer >60 >60 mL/min    Comment: (NOTE) The eGFR has been calculated using the CKD EPI equation. This calculation has not been validated in all clinical situations. eGFR's persistently <60 mL/min signify possible Chronic Kidney Disease.    Anion gap 15 5 - 15  Ethanol     Status: None   Collection Time: 06/23/15  7:36 AM  Result Value Ref Range   Alcohol, Ethyl (B) <5 <5 mg/dL    Comment:        LOWEST DETECTABLE LIMIT FOR SERUM ALCOHOL IS 5 mg/dL FOR MEDICAL PURPOSES ONLY   Acetaminophen level     Status: Abnormal   Collection Time: 06/23/15  7:36 AM  Result Value Ref Range   Acetaminophen (Tylenol), Serum <10 (L) 10 - 30 ug/mL    Comment:        THERAPEUTIC CONCENTRATIONS VARY SIGNIFICANTLY. A RANGE OF 10-30 ug/mL MAY BE AN EFFECTIVE CONCENTRATION FOR MANY PATIENTS. HOWEVER, SOME ARE BEST TREATED AT CONCENTRATIONS OUTSIDE THIS RANGE. ACETAMINOPHEN CONCENTRATIONS >150 ug/mL AT 4 HOURS AFTER INGESTION AND >50 ug/mL AT 12 HOURS AFTER INGESTION ARE OFTEN ASSOCIATED WITH TOXIC REACTIONS.   Salicylate level     Status: None   Collection Time: 06/23/15  7:36 AM  Result Value Ref Range   Salicylate Lvl <3.4 2.8 - 30.0 mg/dL  CBC with Differential     Status: None   Collection Time: 06/23/15  7:36 AM  Result Value Ref Range   WBC 6.9 4.0 - 10.5 K/uL   RBC 4.60 3.87 - 5.11 MIL/uL   Hemoglobin 13.8 12.0 - 15.0 g/dL   HCT 40.7 36.0 - 46.0 %   MCV 88.5 78.0 - 100.0 fL   MCH 30.0 26.0 - 34.0 pg   MCHC 33.9 30.0 - 36.0 g/dL   RDW 12.6 11.5 - 15.5 %   Platelets 195 150 - 400 K/uL   Neutrophils Relative % 64 %   Neutro Abs 4.4 1.7 - 7.7 K/uL   Lymphocytes Relative  27 %   Lymphs Abs 1.9 0.7 - 4.0 K/uL   Monocytes Relative 7 %   Monocytes Absolute 0.5 0.1 - 1.0 K/uL   Eosinophils Relative 2 %   Eosinophils Absolute 0.1 0.0 - 0.7 K/uL   Basophils Relative 0 %   Basophils Absolute 0.0 0.0 - 0.1 K/uL  I-Stat beta hCG blood, ED     Status: None    Collection Time: 06/23/15  7:46 AM  Result Value Ref Range   I-stat hCG, quantitative <5.0 <5 mIU/mL   Comment 3            Comment:   GEST. AGE      CONC.  (mIU/mL)   <=1 WEEK        5 - 50     2 WEEKS       50 - 500     3 WEEKS       100 - 10,000     4 WEEKS     1,000 - 30,000        FEMALE AND NON-PREGNANT FEMALE:     LESS THAN 5 mIU/mL     Blood Alcohol level:  Lab Results  Component Value Date   ETH <5 56/43/3295    Metabolic Disorder Labs:  No results found for: HGBA1C, MPG No results found for: PROLACTIN Lab Results  Component Value Date   CHOL 166 11/20/2013   TRIG 77 11/20/2013   HDL 50 11/20/2013   CHOLHDL 3.3 11/20/2013   VLDL 15 11/20/2013   LDLCALC 101* 11/20/2013    Current Medications: Current Facility-Administered Medications  Medication Dose Route Frequency Provider Last Rate Last Dose  . acetaminophen (TYLENOL) tablet 650 mg  650 mg Oral Q6H PRN Patrecia Pour, NP      . alum & mag hydroxide-simeth (MAALOX/MYLANTA) 200-200-20 MG/5ML suspension 30 mL  30 mL Oral Q4H PRN Patrecia Pour, NP      . hydrOXYzine (ATARAX/VISTARIL) tablet 25 mg  25 mg Oral Q6H PRN Laverle Hobby, PA-C   25 mg at 06/23/15 2157  . lamoTRIgine (LAMICTAL) tablet 25 mg  25 mg Oral Q lunch Shep Porter, MD      . LORazepam (ATIVAN) tablet 1 mg  1 mg Oral Q6H PRN Ursula Alert, MD       Or  . LORazepam (ATIVAN) injection 1 mg  1 mg Intramuscular Q6H PRN Scharlene Catalina, MD      . magnesium hydroxide (MILK OF MAGNESIA) suspension 30 mL  30 mL Oral Daily PRN Patrecia Pour, NP      . OLANZapine (ZYPREXA) injection 5 mg  5 mg Intramuscular Q8H PRN Ursula Alert, MD       Or  . OLANZapine zydis (ZYPREXA) disintegrating tablet 5 mg  5 mg Oral Q8H PRN Jamin Humphries, MD      . OLANZapine (ZYPREXA) tablet 2.5 mg  2.5 mg Oral Q lunch Maris Bena, MD      . OLANZapine zydis (ZYPREXA) disintegrating tablet 5 mg  5 mg Oral QHS Ursula Alert, MD       PTA Medications: No prescriptions  prior to admission    Musculoskeletal: Strength & Muscle Tone: within normal limits Gait & Station: normal Patient leans: N/A  Psychiatric Specialty Exam: Physical Exam  Nursing note and vitals reviewed. Constitutional:  I concur with PE done in ED    Review of Systems  Unable to perform ROS: mental acuity    Blood pressure 117/92, pulse 75, temperature  98.5 F (36.9 C), temperature source Oral, resp. rate 18, height _0  (1.6 m), weight 54.432 kg (120 lb), last menstrual period 06/23/2015, SpO2 100 %.Body mass index is 21.26 kg/(m^2).  General Appearance: Fairly Groomed  Engineer, water::  Fair  Speech:  Pressured  Volume:  Increased  Mood:  Angry, Anxious and Irritable  Affect:  Labile  Thought Process:  Circumstantial and Tangential  Orientation:  Full (Time, Place, and Person)  Thought Content:  Paranoid Ideation and Rumination  Suicidal Thoughts:  denies , but is agitated , disorganized making her a danger to self or others  Homicidal Thoughts:  No  Memory:  Immediate;   Fair Recent;   Fair Remote;   Fair  Judgement:  Impaired  Insight:  Shallow  Psychomotor Activity:  Increased and Restlessness  Concentration:  Poor  Recall:  [employer] of Knowledge:Fair  Language: Fair  Akathisia:  No  Handed:  Right  AIMS (if indicated):     Assets:  Social Support  ADL's:  Intact  Cognition: WNL  Sleep:  Number of Hours: 3.25     Treatment Plan Summary:Elizabeth Boyd is a 35 year old African American female, who is married , lives in [place] , with her parents and 4 children , is unemployed , has a past hx of schizoaffective disorder , cannabis abuse ( which she denies ) , was brought to the ED by her husband for disorganized behavior at home.Pt continues to be aggressive , agitated , loud and disruptive with mood lability. Will uphold the IVC and start treatment.   Daily contact with patient to assess and evaluate symptoms and progress in treatment and Medication  management   Patient will benefit from inpatient treatment and stabilization.  Estimated length of stay is 5-7 days.  Reviewed past medical records,treatment plan. Will start a trial of Zyprexa zydis 2.5 mg po daily and 5 mg po qhs for psychosis/mood sx. Will add Lamictal 25 mg po daily for mood sx. Will make available PRN medications as per agitation protocol. Collateral information was obtained from family. Will obtain records from [facility] if she agrees.  Will continue to monitor vitals ,medication compliance and treatment side effects while patient is here.  Will monitor for medical issues as well as call consult as needed.  Reviewed labs - cbc - wnl, cmp - bilirubin slightly high - will get acute hepatitis panel, BAL<5 , urine pregnancy test - negative ,will order UDS, TSH, lipid panel,hba1c, PL. CSW will start working on disposition.  Patient to participate in therapeutic milieu .       Observation Level/Precautions:  15 minute checks    Psychotherapy:  Individual and group therapy     Consultations:  Social worker  Discharge Concerns:  Stability/safety       I certify that inpatient services furnished can reasonably be expected to improve the patient's condition.    Melicia Esqueda, MD 3/1/201712:18 PM
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
## 2015-06-24 NOTE — Tx Team (Signed)
Interdisciplinary Treatment Plan Update (Adult) Date: 06/24/2015   Date: 06/24/2015 1:25 PM  Progress in Treatment:  Attending groups: No Participating in groups: No Taking medication as prescribed: Yes  Tolerating medication: Yes  Family/Significant other contact made: Yes, with husband Patient understands diagnosis: No, Pt minimizes symptoms and denies need for hospitalization Discussing patient identified problems/goals with staff: Yes  Medical problems stabilized or resolved: Yes  Denies suicidal/homicidal ideation: Yes Patient has not harmed self or Others: Yes   New problem(s) identified: None identified at this time.   Discharge Plan or Barriers: Pt will return home with family and follow-up with outpatient resources  Additional comments:  Patient and CSW reviewed pt's identified goals and treatment plan. Patient verbalized understanding and agreed to treatment plan. CSW reviewed [facility] "Discharge Process and Patient Involvement" Form. Pt verbalized understanding of information provided and signed form.   Will start a trial of Zyprexa zydis 2.5 mg po daily and 5 mg po qhs for psychosis/mood sx. Will add Lamictal 25 mg po daily for mood sx. Will make available PRN medications as per agitation protocol.  Reason for Continuation of Hospitalization:  Medication stabilization Mania  Estimated length of stay: 3-5 days  Review of initial/current patient goals per problem list:   1.  Goal(s): Patient will participate in aftercare plan  Met:  Yes  Target date: 3-5 days from date of admission   As evidenced by: Patient will participate within aftercare plan AEB aftercare provider and housing plan at discharge being identified.   06/24/15: Pt will return home and follow-up with outpatient resources  6. Goal (s):  Patient will demonstrate decreased signs of mania . Met:  No . Target date: 3-5 days from date of admission  . As evidenced by:  Patient demonstrate decreased signs of  mania AEB decreased mood instability and demonstration of stable mood    -06/24/15: Pt continues to have labile    mood, is intrusive, and demonstrates    flight of ideas.  Attendees:  Patient:    Family:    Physician: Dr. Parke Poisson, MD  06/24/2015 1:25 PM  Nursing: Lars Pinks, RN Case manager  06/24/2015 1:25 PM  Clinical Social Worker Peri Maris, LCSWA 06/24/2015 1:25 PM  Other: Tilden Fossa, LCSWA 06/24/2015 1:25 PM  Clinical:  RN 06/24/2015 1:25 PM  Other: , RN Charge Nurse 06/24/2015 1:25 PM  Other: Hilda Lias, [place], [place] Social Work [phone]

## 2015-06-24 NOTE — BHH Counselor (Signed)
Adult Comprehensive Assessment  Patient ID: Elizabeth Boyd, female   DOB: 03-05-1981, 35 y.o.   MRN: [id number]  Information Source: Information source: Patient  Current Stressors:  Educational / Learning stressors: None reported Employment / Job issues: Pt is unemployed Family Relationships: Husband lives in [place], commutes to see her Surveyor, quantity / Lack of resources (include bankruptcy): No income Housing / Lack of housing: Lives with parents and her minor children; no reported stressors Physical health (include injuries & life threatening diseases): None reported Social relationships: None reported Substance abuse: None reported Bereavement / Loss: None reported  Living/Environment/Situation:  Living Arrangements: Parent, Children Living conditions (as described by patient or guardian): lives with parents and 4 children; brother is now living there too How long has patient lived in current situation?: always lived with parents What is atmosphere in current home: Supportive, Comfortable  Family History:  Marital status: Married Number of Years Married: 10 What types of issues is patient dealing with in the relationship?: husband has always lived in [place]; comes to [place] occassionally Does patient have children?: Yes How many children?: 4 How is patient's relationship with their children?: great relationship  Childhood History:  By whom was/is the patient raised?: Both parents Description of patient's relationship with caregiver when they were a child: good childhood Patient's description of current relationship with people who raised him/her: supportive relationship with parents How were you disciplined when you got in trouble as a child/adolescent?: did not get in trouble often Does patient have siblings?: Yes Number of Siblings: 2 Description of patient's current relationship with siblings: not a close relationshp but they get along Did patient suffer any  verbal/emotional/physical/sexual abuse as a child?: No Did patient suffer from severe childhood neglect?: No Has patient ever been sexually abused/assaulted/raped as an adolescent or adult?: No Was the patient ever a victim of a crime or a disaster?: No Witnessed domestic violence?: No Has patient been effected by domestic violence as an adult?: No  Education:  Highest grade of school patient has completed: Immunologist; [employer] off an on Currently a student?: No Learning disability?: No  Employment/Work Situation:   Employment situation: Unemployed Patient's job has been impacted by current illness: No What is the longest time patient has a held a job?: 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 Where was the patient employed at that time?: [employer] Has patient ever been in the [employer]?: No Has patient ever served in combat?: No Did You Receive Any Psychiatric Treatment/Services While in Equities trader?: No Are There Guns or Other Weapons in Your Home?: No  Financial Resources:   Financial resources: Income from spouse, Support from parents / caregiver, Medicaid Does patient have a representative payee or guardian?: No  Alcohol/Substance Abuse:   What has been your use of drugs/alcohol within the last 12 months?: drinks occassionally in a social setting If attempted suicide, did drugs/alcohol play a role in this?: No Alcohol/Substance Abuse Treatment Hx: Denies past history Has alcohol/substance abuse ever caused legal problems?: No  Social Support System:   Conservation officer, nature Support System: Fair Museum/gallery exhibitions officer System: family is supportive Type of faith/religion: None  How does patient's faith help to cope with current illness?: n/a  Leisure/Recreation:   Leisure and Hobbies: sitting outside, stretching, working out  Strengths/Needs:   What things does the patient do well?: Nothing, "just breathing I guess" In what areas does  patient struggle / problems for patient: nothing  Discharge Plan:  Does patient have access to transportation?: Yes Will patient be returning to same living situation after discharge?: Yes Currently receiving community mental health services: No If no, would patient like referral for services when discharged?: Yes (What county?) Medical sales representative) Does patient have financial barriers related to discharge medications?: No  Summary/Recommendations:     Patient is a 35 year old female with a diagnosis of Schizoaffective Disorder by history. Pt presented to the hospital under IVC taken out by husband after Pt reportedly began speaking in an African accent, developed aggressive behaviors, and needed minimal sleep. Pt unable to identify any triggers for recent admission. Patient will benefit from crisis stabilization, medication evaluation, group therapy and psycho education in addition to case management for discharge planning. At discharge it is recommended that Pt remain compliant with established discharge plan and continued treatment.    [name]. 06/24/2015

## 2015-06-24 NOTE — Progress Notes (Signed)
Adult Psychoeducational Group Note  Date:  06/24/2015 Time:  9:28 PM  Group Topic/Focus:  Wrap-Up Group:   The focus of this group is to help patients review their daily goal of treatment and discuss progress on daily workbooks.  Participation Level:  Did Not Attend  Participation Quality:  Did not attend  Affect:  Did not attend  Cognitive:  Did not attend  Insight: None  Engagement in Group:  Did not attend  Modes of Intervention:  Did not attend  Additional Comments:  Patient did not attend wrap up group tonight.   Baby Stairs L Marie Borowski 06/24/2015, 9:28 PM

## 2015-06-25 DIAGNOSIS — F250 Schizoaffective disorder, bipolar type: Secondary | ICD-10-CM | POA: Diagnosis present

## 2015-06-25 LAB — LIPID PANEL
CHOL/HDL RATIO: 3.4 ratio
Cholesterol: 154 mg/dL (ref 0–200)
HDL: 45 mg/dL (ref >40)
LDL Cholesterol: 82 mg/dL (ref 0–99)
Triglycerides: 137 mg/dL (ref <150)
VLDL: 27 mg/dL (ref 0–40)

## 2015-06-25 LAB — TSH: TSH: 0.807 u[IU]/mL (ref 0.350–4.500)

## 2015-06-25 NOTE — Progress Notes (Signed)
DAR Note: Elizabeth Boyd has been visible on the unit.  She has been pleasant and cooperative.  No yelling and cursing at staff.  She has taken her medication without difficulty.  She denies SI/HI or A/V hallucinations.  She has been attending groups and interacting with peers.  She completed her self inventory and reports that her depression, hopelessness and anxiety are 0/10.  Her goal today is "living" and she will accomplish her goal by "breathing."  Encouraged continued participation in group and unit activities.  Q 15 minute checks maintained for safety.  We will continue to monitor the progress towards her goals.

## 2015-06-25 NOTE — Progress Notes (Signed)
D: Patient alert and oriented x 4. Patient denies pain/SI/HI/AVH. Patient continues to talk to self while in room. During assessment patient became verbally aggressive toward writer and walked out of room and used the phone in hall. Patient had to be redirected while on phone cursing. Patient states, "Mind you business bitch."   A: Staff to monitor Q 15 mins for safety. Encouragement and support offered. Scheduled medications administered per orders. R: Patient remains safe on the unit.Patient visible on the unit. Patient taking administered medications.

## 2015-06-25 NOTE — B[facility] Group Notes (Signed)
[facility] Group Notes:  (Nursing/MHT/Case Management/Adjunct)  Date:  06/25/2015  Time:  10:15 am  Type of Therapy:  Nurse Education  Participation Level:  Active  Participation Quality:  Appropriate and Attentive  Affect:  Appropriate  Cognitive:  Appropriate  Insight:  Improving  Engagement in Group:  Developing/Improving and Engaged  Modes of Intervention:  Discussion and Education  Summary of Progress/Problems:  Group topic was Leisure and lifestyle changes.  Discussed making positive changes with leisure activities, trying to focus on positives, make changes with our negative feelings/ideas and importance of daily goals.  Aishi states that she likes music, dancing, going out and playing with her kids, taking a bath or going to gym as her leisure activities.  She feels that a positive attribute is being Attentive.   Norm Parcel Quinci Gavidia 06/25/2015, 11:46 AM

## 2015-06-25 NOTE — Progress Notes (Addendum)
Patient refused to have labs drawn but MHT spoke to patient and went to have drawn.

## 2015-06-25 NOTE — Progress Notes (Signed)
Adult Psychoeducational Group Note  Date:  06/25/2015 Time:  9:16 PM  Group Topic/Focus:  Wrap-Up Group:   The focus of this group is to help patients review their daily goal of treatment and discuss progress on daily workbooks.  Participation Level:  Active  Participation Quality:  Appropriate  Affect:  Appropriate  Cognitive:  Appropriate  Insight: Appropriate  Engagement in Group:  Engaged  Modes of Intervention:  Discussion  Additional Comments: The patient expressed that she attended group.The patient also said that her day was good and rates today a 9.  Octavio Manns 06/25/2015, 9:16 PM

## 2015-06-25 NOTE — B[facility] Group Notes (Signed)
[place] LCSW Group Therapy 06/25/2015 1:15pm  Type of Therapy: Group Therapy- Balance in Life  Participation Level: Active   Description of the Group:  The topic for group was balance in life. Today's group focused on defining balance in one's own words, identifying things that can knock one off balance, and exploring healthy ways to maintain balance in life. Group members were asked to provide an example of a time when they felt off balance, describe how they handled that situation,and process healthier ways to regain balance in the future. Group members were asked to share the most important tool for maintaining balance that they learned while at [facility] and how they plan to apply this method after discharge.  Summary of Patient Progress Pt discussed how a picture of a dew-covered cherry represents the need to have the basics needs of life met or else one risks having an unbalanced life. Her speech was somewhat circumstantial, however participation was appropriate.    Therapeutic Modalities:   Cognitive Behavioral Therapy Solution-Focused Therapy Assertiveness Training   Peri Maris, [place] 06/25/2015 1:36 PM

## 2015-06-25 NOTE — Progress Notes (Signed)
[facility] MD Progress Note  06/25/2015 1:43 PM SAHANA BOYLAND  MRN:  [id number] Subjective:  Pt states " I feel fine.'  Objective:Akaya D Rodda-Issaka is a 35 year old [place] American female, who is married , lives in [place] , with her parents and 4 children , is unemployed , has a past hx of schizoaffective disorder , cannabis abuse ( which she denies ) , was brought to the ED by her husband for disorganized behavior at home.  Patient seen and chart reviewed.Discussed patient with treatment team.  Pt today continues to be vaguely irritable , impulsive , hyperactive , but is improved since yesterday. Pt denies any new concerns. Pt per nursing was very intrusive , labile , seen as using profanity on the unit and also was aggressive with her husband when he came to visit last night. Pt continues to need support.   Principal Problem: Schizoaffective disorder, bipolar type (HCC)    Diagnosis:   Patient Active Problem List   Diagnosis Date Noted  . Schizoaffective disorder, bipolar type (HCC) [F25.0] 06/25/2015  . Cannabis use disorder, severe, dependence (HCC) [F12.20] 06/24/2015  . Vitiligo [L80] 06/24/2015  . Pelvic pain in female [R10.2] 01/12/2015   Total Time spent with patient: 30 minutes  Past Psychiatric History: Patient was admitted at [facility] in the past in 2005 for schizoaffective do, cannabis abuse. Pt also was admitted at [facility] in 2004. Pt denies past hx of suicide attempts. Pt was referred to [facility] center in the past, but was noncompliant  Past Medical History: vitiligo   Family History:  Family History  Problem Relation Age of Onset  . Diabetes Paternal Grandmother    Family Psychiatric  History: Pt denies hx of mental illness, substance abuse, suicide in family Social History: Pt is married , lives in [place] with her parents , 4 children aged 16,7,9,12. Pt reports her husband works in [place] and comes and goes . She has filed for divorce against her husband. Pt is unemployed ,  states her family helps out.Reports she graduated college History  Alcohol Use No     History  Drug Use  . Yes  . Special: Marijuana    Social History   Social History  . Marital Status: Married    Spouse Name: N/A  . Number of Children: N/A  . Years of Education: N/A   Social History Main Topics  . Smoking status: Never Smoker   . Smokeless tobacco: None  . Alcohol Use: No  . Drug Use: Yes    Special: Marijuana  . Sexual Activity:    Partners: Male    Birth Control/ Protection: Condom   Other Topics Concern  . None   Social History Narrative   Additional Social History:    Pain Medications: see mar Prescriptions: see mar Over the Counter: see mar History of alcohol / drug use?: No history of alcohol / drug abuse                    Sleep: Fair  Appetite:  Fair  Current Medications: Current Facility-Administered Medications  Medication Dose Route Frequency Provider Last Rate Last Dose  . acetaminophen (TYLENOL) tablet 650 mg  650 mg Oral Q6H PRN Charm Rings, NP   650 mg at 06/25/15 0826  . alum & mag hydroxide-simeth (MAALOX/MYLANTA) 200-200-20 MG/5ML suspension 30 mL  30 mL Oral Q4H PRN Charm Rings, NP      . hydrOXYzine (ATARAX/VISTARIL) tablet 25 mg  25 mg  Oral Q6H PRN Kerry Hough, PA-C   25 mg at 06/24/15 2141  . lamoTRIgine (LAMICTAL) tablet 25 mg  25 mg Oral Q lunch Jomarie Longs, MD   25 mg at 06/25/15 1234  . LORazepam (ATIVAN) tablet 1 mg  1 mg Oral Q6H PRN Jomarie Longs, MD   1 mg at 06/24/15 1851   Or  . LORazepam (ATIVAN) injection 1 mg  1 mg Intramuscular Q6H PRN Ninoshka Wainwright, MD      . magnesium hydroxide (MILK OF MAGNESIA) suspension 30 mL  30 mL Oral Daily PRN Charm Rings, NP      . OLANZapine (ZYPREXA) injection 5 mg  5 mg Intramuscular Q8H PRN Jomarie Longs, MD       Or  . OLANZapine zydis (ZYPREXA) disintegrating tablet 5 mg  5 mg Oral Q8H PRN Jomarie Longs, MD   5 mg at 06/24/15 1240  . OLANZapine (ZYPREXA) tablet  2.5 mg  2.5 mg Oral Q lunch Inette Doubrava, MD   2.5 mg at 06/25/15 1234  . OLANZapine zydis (ZYPREXA) disintegrating tablet 5 mg  5 mg Oral QHS Jomarie Longs, MD   5 mg at 06/24/15 2143    Lab Results:  Results for orders placed or performed during the hospital encounter of 06/23/15 (from the past 48 hour(s))  TSH     Status: None   Collection Time: 06/25/15  6:25 AM  Result Value Ref Range   TSH 0.807 0.350 - 4.500 uIU/mL    Comment: Performed at [facility]  Lipid panel     Status: None   Collection Time: 06/25/15  6:25 AM  Result Value Ref Range   Cholesterol 154 0 - 200 mg/dL   Triglycerides 604 <540 mg/dL   HDL 45 >98 mg/dL   Total CHOL/HDL Ratio 3.4 RATIO   VLDL 27 0 - 40 mg/dL   LDL Cholesterol 82 0 - 99 mg/dL    Comment:        Total Cholesterol/HDL:CHD Risk Coronary Heart Disease Risk Table                     Men   Women  1/2 Average Risk   3.4   3.3  Average Risk       5.0   4.4  2 X Average Risk   9.6   7.1  3 X Average Risk  23.4   11.0        Use the calculated Patient Ratio above and the CHD Risk Table to determine the patient's CHD Risk.        ATP III CLASSIFICATION (LDL):  <100     mg/dL   Optimal  119-147  mg/dL   Near or Above                    Optimal  130-159  mg/dL   Borderline  829-562  mg/dL   High  >130     mg/dL   Very High Performed at [facility]     Blood Alcohol level:  Lab Results  Component Value Date   [facility] <5 06/23/2015    Physical Findings: AIMS: Facial and Oral Movements Muscles of Facial Expression: None, normal Lips and Perioral Area: None, normal Jaw: None, normal Tongue: None, normal,Extremity Movements Upper (arms, wrists, hands, fingers): None, normal Lower (legs, knees, ankles, toes): None, normal, Trunk Movements Neck, shoulders, hips: None, normal, Overall Severity Severity of abnormal movements (highest score from  questions above): None, normal Incapacitation due to abnormal  movements: None, normal Patient's awareness of abnormal movements (rate only patient's report): No Awareness, Dental Status Current problems with teeth and/or dentures?: No Does patient usually wear dentures?: No (bracelets)  CIWA:  CIWA-Ar Total: 3 COWS:  COWS Total Score: 2  Musculoskeletal: Strength & Muscle Tone: within normal limits Gait & Station: normal Patient leans: N/A  Psychiatric Specialty Exam: Review of Systems  Psychiatric/Behavioral: Positive for substance abuse. The patient is nervous/anxious.   All other systems reviewed and are negative.   Blood pressure 103/85, pulse 122, temperature 98.1 F (36.7 C), temperature source Oral, resp. rate 16, height  (1.6 m), weight 54.432 kg (120 lb), last menstrual period 06/23/2015, SpO2 100 %.Body mass index is 21.26 kg/(m^2).  General Appearance: Fairly Groomed  Patent attorney::  Fair  Speech:  Pressured  Volume:  Increased  Mood:  Irritable  Affect:  Labile  Thought Process:  Irrelevant  Orientation:  Full (Time, Place, and Person)  Thought Content:  Paranoid Ideation and Rumination  Suicidal Thoughts:  No  Homicidal Thoughts:  No  Memory:  Immediate;   Fair Recent;   Fair Remote;   Fair  Judgement:  Impaired  Insight:  Shallow  Psychomotor Activity:  Increased and Restlessness  Concentration:  Poor  Recall:  Poor  Fund of Knowledge:Fair  Language: Fair  Akathisia:  No  Handed:  Right  AIMS (if indicated):     Assets:  Desire for Improvement  ADL's:  Intact  Cognition: WNL  Sleep:  Number of Hours: 6.75   Treatment Plan Summary:Baila D Fuson-Issaka is a 35 year old [place] American female, who is married , lives in [place] , with her parents and 4 children , is unemployed , has a past hx of schizoaffective disorder , cannabis abuse ( which she denies ) , was brought to the ED by her husband for disorganized behavior at home.Pt continues to be hyperactive, aggressive on the unit. Will continue treatment.   Daily  contact with patient to assess and evaluate symptoms and progress in treatment and Medication management  Will continue Zyprexa zydis 2.5 mg po daily at lunch and 5 mg po qhs for psychosis/mood sx. Will continue Lamictal 25 mg po daily for mood sx. Will make available PRN medications as per agitation protocol. Collateral information was obtained from family. Will obtain records from [place] if she agrees.  Will continue to monitor vitals ,medication compliance and treatment side effects while patient is here.  Will monitor for medical issues as well as call consult as needed.  Reviewed labs cmp - bilirubin slightly high - will get acute hepatitis panel - pending  UDS- needs to be collected , TSH- wnl , lipid panel- wnl ,hba1c- pending , PL- pending. CSW will start working on disposition.  Patient to participate in therapeutic milieu .    Alyah Boehning, MD 06/25/2015, 1:43 PM

## 2015-06-26 DIAGNOSIS — E221 Hyperprolactinemia: Secondary | ICD-10-CM | POA: Clinically undetermined

## 2015-06-26 LAB — HEPATITIS PANEL, ACUTE
HCV Ab: <0.1 s/co ratio (ref 0.0–0.9)
HEP B C IGM: NEGATIVE
HEP B S AG: NEGATIVE
Hep A IgM: NEGATIVE

## 2015-06-26 LAB — PROLACTIN: PROLACTIN: 180 ng/mL — AB (ref 4.8–23.3)

## 2015-06-26 LAB — HEMOGLOBIN A1C
HEMOGLOBIN A1C: 5.3 % (ref 4.8–5.6)
MEAN PLASMA GLUCOSE: 105 mg/dL

## 2015-06-26 MED ORDER — BENZTROPINE MESYLATE 0.5 MG PO TABS
0.5000 mg | ORAL_TABLET | ORAL | Status: DC
  Administered 2015-06-26 – 2015-06-30 (×8): 0.5 mg via ORAL
  Filled 2015-06-26 (×11): qty 1

## 2015-06-26 MED ORDER — ARIPIPRAZOLE 5 MG PO TABS
5.0000 mg | ORAL_TABLET | ORAL | Status: DC
  Administered 2015-06-26 – 2015-06-30 (×8): 5 mg via ORAL
  Filled 2015-06-26 (×11): qty 1

## 2015-06-26 NOTE — Progress Notes (Signed)
D: Patient alert and oriented x 4. Patient denies pain/SI/HI/AVH. Patient apologized for behaviors the past few days. Patient states, "I was manic, I didn't realized it at the time, but looking back I know I was." This Clinical research associatewriter educated patient the importance of sleep and medications compliance to try to keep from going into manic phase.  A: Staff to monitor Q 15 mins for safety. Encouragement and support offered. Scheduled medications administered per orders. R: Patient remains safe on the unit. Patient attended group tonight. Patient visible on hte unit and interacting with peers. Patient taking administered medications.

## 2015-06-26 NOTE — Progress Notes (Signed)
[facility]  06/26/2015 12:15 PM Venia CarbonDwan D Whitfill-Issaka  MRN:  [id number] Subjective:  Pt states " I am OK, I think I am able to control my emotions better.'   Objective:Alinda D Harcum-Issaka is a 35 year old [place] female, who is married , [place] , with her parents and 4 children , is unemployed , has a past hx of schizoaffective disorder , cannabis abuse ( which she denies ) , was brought to the ED by her husband for disorganized behavior at home.  Patient seen and chart reviewed.Discussed patient with treatment team.  Pt today is seen as less irritable , is able to answer questions appropriately . Pt however during our conversation was seen as tearful and sad and could not pinpoint what her triggers were. Pt per nursing has been compliant on her medications , has had no disruptive issues noted on the unit. Pt continues to need support.   Principal Problem: Schizoaffective disorder, bipolar type (HCC)    Diagnosis:   Patient Active Problem List   Diagnosis Date Noted  . Hyperprolactinemia (HCC) [E22.1] 06/26/2015  . Schizoaffective disorder, bipolar type (HCC) [F25.0] 06/25/2015  . Cannabis use disorder, severe, dependence (HCC) [F12.20] 06/24/2015  . Vitiligo [L80] 06/24/2015  . Pelvic pain in female [R10.2] 01/12/2015   Total Time spent with patient: 30 minutes  Past Psychiatric History: [facility] for schizoaffective do, cannabis abuse. Pt also [facility]. Pt denies past hx of suicide attempts. [facility], but was noncompliant  Past Medical History: vitiligo   Family History:  Family History  Problem Relation Age of Onset  . Diabetes Paternal Grandmother    Family Psychiatric  History: Pt denies hx of mental illness, substance abuse, suicide in family Social History: Pt is married , [place] with her parents , 4 children aged 656,7,9,12. Pt reports her husband  works in [place] and comes and goes . She has filed for divorce against her husband. Pt is unemployed , states her family helps out.Reports she graduated college History  Alcohol Use No     History  Drug Use  . Yes  . Special: Marijuana    Social History   Social History  . Marital Status: Married    Spouse Name: N/A  . Number of Children: N/A  . Years of Education: N/A   Social History Main Topics  . Smoking status: Never Smoker   . Smokeless tobacco: None  . Alcohol Use: No  . Drug Use: Yes    Special: Marijuana  . Sexual Activity:    Partners: Male    Birth Control/ Protection: Condom   Other Topics Concern  . None   Social History Narrative   Additional Social History:    Pain Medications: see mar Prescriptions: see mar Over the Counter: see mar History of alcohol / drug use?: No history of alcohol / drug abuse                    Sleep: Fair  Appetite:  Fair  Current Medications: Current Facility-Administered Medications  Medication Dose Route Frequency Provider Last Rate Last Dose  . acetaminophen (TYLENOL) tablet 650 mg  650 mg Oral Q6H PRN Charm RingsJamison Y Lord, NP   650 mg at 06/25/15 0826  . alum & mag hydroxide-simeth (MAALOX/MYLANTA) 200-200-20 MG/5ML suspension 30 mL  30 mL Oral Q4H PRN  Charm Rings, NP      . ARIPiprazole (ABILIFY) tablet 5 mg  5 mg Oral BH-qamhs Iesha Summerhill, MD      . benztropine (COGENTIN) tablet 0.5 mg  0.5 mg Oral BH-qamhs Lyndle Pang, MD      . hydrOXYzine (ATARAX/VISTARIL) tablet 25 mg  25 mg Oral Q6H PRN Kerry Hough, PA-C   25 mg at 06/25/15 2159  . lamoTRIgine (LAMICTAL) tablet 25 mg  25 mg Oral Q lunch Jomarie Longs, MD   25 mg at 06/25/15 1234  . LORazepam (ATIVAN) tablet 1 mg  1 mg Oral Q6H PRN Jomarie Longs, MD   1 mg at 06/25/15 1933   Or  . LORazepam (ATIVAN) injection 1 mg  1 mg Intramuscular Q6H PRN Jayson Waterhouse, MD      . magnesium hydroxide (MILK OF MAGNESIA) suspension 30 mL  30 mL Oral Daily PRN  Charm Rings, NP      . OLANZapine (ZYPREXA) injection 5 mg  5 mg Intramuscular Q8H PRN Jomarie Longs, MD       Or  . OLANZapine zydis (ZYPREXA) disintegrating tablet 5 mg  5 mg Oral Q8H PRN Jomarie Longs, MD   5 mg at 06/24/15 1240    Lab Results:  Results for orders placed or performed during the hospital encounter of 06/23/15 (from the past 48 hour(s))  Hepatitis panel, acute     Status: None   Collection Time: 06/25/15  6:25 AM  Result Value Ref Range   Hepatitis B Surface Ag Negative Negative   HCV Ab <0.1 0.0 - 0.9 s/co ratio    Comment: (NOTE)                                  Negative:     < 0.8                             Indeterminate: 0.8 - 0.9                                  Positive:     > 0.9 The CDC recommends that a positive HCV antibody result be followed up with a HCV Nucleic Acid Amplification test (161096). Performed At: [facility] [address] [place], [place] [id number] Mila Homer MD BJ:[id number]    Hep A IgM Negative Negative   Hep B C IgM Negative Negative    Comment: Performed at [facility]  TSH     Status: None   Collection Time: 06/25/15  6:25 AM  Result Value Ref Range   TSH 0.807 0.350 - 4.500 uIU/mL    Comment: Performed at [facility]  Lipid panel     Status: None   Collection Time: 06/25/15  6:25 AM  Result Value Ref Range   Cholesterol 154 0 - 200 mg/dL   Triglycerides 086 <578 mg/dL   HDL 45 >46 mg/dL   Total CHOL/HDL Ratio 3.4 RATIO   VLDL 27 0 - 40 mg/dL   LDL Cholesterol 82 0 - 99 mg/dL    Comment:        Total Cholesterol/HDL:CHD Risk Coronary Heart Disease Risk Table                     Men  Women  1/2 Average Risk   3.4   3.3  Average Risk       5.0   4.4  2 X Average Risk   9.6   7.1  3 X Average Risk  23.4   11.0        Use the calculated Patient Ratio above and the CHD Risk Table to determine the patient's CHD Risk.        ATP III CLASSIFICATION (LDL):  <100      mg/dL   Optimal  161-096  mg/dL   Near or Above                    Optimal  130-159  mg/dL   Borderline  045-409  mg/dL   High  >811     mg/dL   Very High Performed at [facility]   Hemoglobin A1c     Status: None   Collection Time: 06/25/15  6:25 AM  Result Value Ref Range   Hgb A1c MFr Bld 5.3 4.8 - 5.6 %    Comment: (NOTE)         Pre-diabetes: 5.7 - 6.4         Diabetes: >6.4         Glycemic control for adults with diabetes: <7.0    Mean Plasma Glucose 105 mg/dL    Comment: (NOTE) Performed At: [facility] [address] [place], [place] [id number] Mila Homer MD OZ:[id number] Performed at [facility]   Prolactin     Status: Abnormal   Collection Time: 06/25/15  6:25 AM  Result Value Ref Range   Prolactin 180.0 (H) 4.8 - 23.3 ng/mL    Comment: (NOTE) Performed At: [facility] [address] [place], [place] [id number] Mila Homer MD KG:[id number] Performed at [facility]     Blood Alcohol level:  Lab Results  Component Value Date   [facility] <5 06/23/2015    Physical Findings: AIMS: Facial and Oral Movements Muscles of Facial Expression: None, normal Lips and Perioral Area: None, normal Jaw: None, normal Tongue: None, normal,Extremity Movements Upper (arms, wrists, hands, fingers): None, normal Lower (legs, knees, ankles, toes): None, normal, Trunk Movements Neck, shoulders, hips: None, normal, Overall Severity Severity of abnormal movements (highest score from questions above): None, normal Incapacitation due to abnormal movements: None, normal Patient's awareness of abnormal movements (rate only patient's report): No Awareness, Dental Status Current problems with teeth and/or dentures?: No Does patient usually wear dentures?: No (bracelets)  CIWA:  CIWA-Ar Total: 3 COWS:  COWS Total Score: 2  Musculoskeletal: Strength & Muscle Tone: within normal limits Gait & Station:  normal Patient leans: N/A  Psychiatric Specialty Exam: Review of Systems  Psychiatric/Behavioral: Positive for substance abuse. The patient is nervous/anxious.   All other systems reviewed and are negative.   Blood pressure 109/66, pulse 107, temperature 97.8 F (36.6 C), temperature source Oral, resp. rate 18, height  (1.6 m), weight 54.432 kg (120 lb), last menstrual period 06/23/2015, SpO2 100 %.Body mass index is 21.26 kg/(m^2).  General Appearance: Fairly Groomed  Patent attorney::  Fair  Speech:  Pressured improving  Volume:  Increased  Mood:  Irritable improving  Affect:  Labile and Tearful  Thought Process:  Linear  Orientation:  Full (Time, Place, and Person)  Thought Content:  Paranoid Ideation and Rumination  Suicidal Thoughts:  No  Homicidal Thoughts:  No  Memory:  Immediate;   Fair Recent;  Fair Remote;   Fair  Judgement:  Impaired  Insight:  Shallow  Psychomotor Activity:  Restlessness  Concentration:  Fair  Recall:  [employer] of Knowledge:Fair  Language: Fair  Akathisia:  No  Handed:  Right  AIMS (if indicated):     Assets:  Desire for Improvement  ADL's:  Intact  Cognition: WNL  Sleep:  Number of Hours: 6.25   Treatment Plan Summary:Krissia D Obenchain-Issaka is a 35 year old [place] American female, who is married , lives in [place] , with her parents and 4 children , is unemployed , has a past hx of schizoaffective disorder , cannabis abuse ( which she denies ) , was brought to the ED by her husband for disorganized behavior at home.Pt is improving .Will continue treatment.   Daily contact with patient to assess and evaluate symptoms and progress in treatment and Medication management  Will DC Zyprexa for hyperprolactinemia and start Abilify 5 mg po bhh qam and qhs for psychosis/mood sx. Will add Cogentin 0.5 mg po bid for EPS. Will continue Lamictal 25 mg po daily for mood sx. Will make available PRN medications as per agitation protocol. Collateral  information was obtained from family.  Will continue to monitor vitals ,medication compliance and treatment side effects while patient is here.  Will monitor for medical issues as well as call consult as needed.  Reviewed labs cmp - bilirubin slightly high ,acute hepatitis panel - negative , UDS- needs to be collected , TSH- wnl , lipid panel- wnl ,hba1c- pending , PL- 180. CSW will continue working on disposition.  Patient to participate in therapeutic milieu .    Season Astacio, MD 06/26/2015, 12:15 PM

## 2015-06-26 NOTE — [facility] Group Notes (Signed)
[facility] LCSW Group Therapy  06/26/2015 1:15pm  Type of Therapy:  Group Therapy vercoming Obstacles  Participation Level:  Active  Participation Quality:  Appropriate   Affect:  Appropriate  Cognitive:  Appropriate and Oriented  Insight:  Developing/Improving and Improving  Engagement in Therapy:  Improving  Modes of Intervention:  Discussion, Exploration, Problem-solving and Support  Description of Group:   In this group patients will be encouraged to explore what they see as obstacles to their own wellness and recovery. They will be guided to discuss their thoughts, feelings, and behaviors related to these obstacles. The group will process together ways to cope with barriers, with attention given to specific choices patients can make. Each patient will be challenged to identify changes they are motivated to make in order to overcome their obstacles. This group will be process-oriented, with patients participating in exploration of their own experiences as well as giving and receiving support and challenge from other group members.  Summary of Patient Progress: Pt identified having "busy days" as an obstacle and needing rest. Pt expressed that she feels confident that she will be able to overcome this obstacle.    Therapeutic Modalities:   Cognitive Behavioral Therapy Solution Focused Therapy Motivational Interviewing Relapse Prevention Therapy   Chad CordialLauren Carter, LCSWA 06/26/2015 3:54 PM

## 2015-06-26 NOTE — Progress Notes (Signed)
Elizabeth Boyd is seen OOB UAL on the 500 hall today...she tolerates this fair. She remains bizarre, at times, and is seen  By herself most of the day, too. AShe did complete her daily assessment and on it she wrote she deneis SI today and she rated her depression, hopelessness and anxiety " 0/0/0", respectively.  A Still refusing to submit urine sample for drug screening , as requested by MD. This writer asked her twice today. R Safety is in place. POC cont

## 2015-06-26 NOTE — Progress Notes (Signed)
Patient ID: Elizabeth Boyd, female   DOB: 07-24-1980, 35 y.o.   MRN: [id number] D: Patient on the phone talking to someone screaming and using profanity. Pt is verbally aggressive when writer told her to tone down her language.  Pt denies SI/HI/AVH and pain. Pt attended and participated in evening wrap up group.  A: Met with pt 1:1. Medications administered as prescribed. Support and encouragement provided. Pt encouraged to discuss feelings and come to staff with any question or concerns.  R: Patient remains safe and complaint with medications.

## 2015-06-26 NOTE — [facility] Group Notes (Signed)
[facility] Note  06/26/2015 8:45 AM  Participation Quality: Alert, Appropriate and Oriented  Mood/Affect: Euphoric  Depression Rating: 0  Anxiety Rating: 0  Thoughts of Suicide: Pt denies SI/HI  Will you contract for safety? Yes  Current AVH: Pt denies  Plan for Discharge/Comments: Pt attended discharge planning group and actively participated in group. CSW discussed suicide prevention education with the group and encouraged them to discuss discharge planning and any relevant barriers. Pt reports being in a great mood, having slept well and appetite improving. Pt is agreeable to a referral to medication management and therapy.  Transportation Means: Pt reports access to transportation  Supports: No supports mentioned at this time  Chad CordialLauren Carter, LCSWA 06/26/2015 1:37 PM

## 2015-06-26 NOTE — Progress Notes (Signed)
Recreation Therapy Notes          03.03.2017   Approximately 8:30am Per MD order LRT met with patient to investigate ways to enhance tx during admission. Patient guarded and minimizing during assessment interview. Patient denied stressors and stated "I'm not able to do that" when asked about circumstances surrounding her admission. Patient shared she dancing and exercises at home, additionally she participates in meditation and yoga at home. Patient identified being outside, reading and poetry as additional interest of hers. Patient shared she enjoys social media and initially stated "its really borderline neglect" while demonstrating her scanning social media sites. When LRT attempted to investigate statement patient discounted amount of time she spends on social media and qualified it with "no more than two hours per day."  Patient shared that she has 4 children - 12, 9, 7 and 6. Patient flatly denies that her children cause her any stress. LRT attempted to normalize child raising by describing stress she has experienced. Patient continued to flatly deny that her children cause her any stress. Patient stated that inpatient admission has been nice because it has given her a chance to "sleep and relax" because she has been taking medication. Patient additionally shared she enjoys the talk therapy sessions.   LRT consulted with MD. MD shared that patient stress level is in fact significant and that she could use tools for reducing her stress level. LRT returned to work with patient during admission on techniques for self-regulation patient can use post d/c.   Laureen Ochs Cormac Wint, LRT/CTRS   Beadie Matsunaga L 06/26/2015 10:14 AM

## 2015-06-27 MED ORDER — LISINOPRIL 10 MG PO TABS
10.0000 mg | ORAL_TABLET | Freq: Every day | ORAL | Status: DC
  Administered 2015-06-27 – 2015-06-30 (×4): 10 mg via ORAL
  Filled 2015-06-27: qty 1
  Filled 2015-06-27: qty 2
  Filled 2015-06-27 (×4): qty 1

## 2015-06-27 NOTE — [facility] Group Notes (Signed)
[facility] Group Notes:  (Nursing/MHT/Case Management/Adjunct)  Date:  06/27/2015  Time:  9a  Type of Therapy:  Psychoeducational Skills--healthy coping skills   Participation Level:  Active  Participation Quality:  Appropriate  Affect:  Appropriate  Cognitive:  Appropriate  Insight:  Appropriate  Engagement in Group:  Engaged  Modes of Intervention:  Discussion, Education and Exploration  Summary of Progress/Problems: discussed healthy coping skills and ways to utilize to maintain stress. Elizabeth Boyd states she likes to dance and deep breath to help manage stress.   [name], [name] 06/27/2015, 10:57 AM

## 2015-06-27 NOTE — Progress Notes (Signed)
Adult Psychoeducational Group Note  Date:  06/27/2015 Time:  9:43 PM  Group Topic/Focus:  Wrap-Up Group:   The focus of this group is to help patients review their daily goal of treatment and discuss progress on daily workbooks.  Participation Level:  Active  Participation Quality:  Appropriate  Affect:  Appropriate  Cognitive:  Appropriate  Insight: Appropriate  Engagement in Group:  Engaged  Modes of Intervention:  Discussion  Additional Comments:  The patient expressed that she attend all groups and had a ok day.  Octavio Mannshigpen, Analie Katzman Lee 06/27/2015, 9:43 PM

## 2015-06-27 NOTE — Plan of Care (Signed)
Problem: Diagnosis: Increased Risk For Suicide Attempt Goal: STG-Patient Will Comply With Medication Regime Outcome: Progressing Pt compliant with medication regime     

## 2015-06-27 NOTE — Progress Notes (Signed)
[name] presents with pleasant mood, affect congruent. Elizabeth Boyd states she is doing well and states '' the medication regiment is helping me, i feel better, i'm just going with the flow to do what the doctor tells me because I feel better and I want to go home.'' she denies any ah/vh or SI/HI. She has been interactive on the unit, engaged in unit programming and socializing with peers. A. Medications given as ordered. Support encouragement provided.  She completed her self inventory and rates her depression, anxiety and hopelessness all at 0/10 on scale, 10 being worst , 0 being least. She states her goal is to '' comply '' R. Patient is safe, remains on q 15 safety checks.

## 2015-06-27 NOTE — Progress Notes (Signed)
Patient ID: Elizabeth Boyd, female   DOB: [birth year], 35 y.o.   MRN: [id number] [facility] MD Progress Note  06/27/2015 3:41 PM Elizabeth Boyd  MRN:  [id number]  Subjective:  Avree reports, "I feel so good. I think that I can go home now. My husband tricked me into coming in here. I feel violated, but I forgive him".   Objective: Elizabeth Boyd is a 35 year old [place] American female, who is married, lives in [place], with her parents and 4 children, is unemployed, has a past hx of schizoaffective disorder , cannabis abuse (which she denies), was brought to the ED by her husband for disorganized behavior at home. Elizabeth Boyd is seen, chart reviewed. She is alert, oriented & visible on the unit. She says she feels so good today that she thinks she could go home. She became emotional during this assessment saying how she feels betrayed by her husband because she thought they were going out for a quick ride, he brought her to the ED instead. She teared up when talking about her children. She is cooperative with staff, taking her medications. Denies any adverse effects. She presents as disheveled. Elizabeth Boyd denies any SIHI, AVH. However, she presents as being paranoid. She wants therapy on outpatient basis after discharge.  Principal Problem: Schizoaffective disorder, bipolar type (HCC)   Diagnosis:   Patient Active Problem List   Diagnosis Date Noted  . Hyperprolactinemia (HCC) [E22.1] 06/26/2015  . Schizoaffective disorder, bipolar type (HCC) [F25.0] 06/25/2015  . Cannabis use disorder, severe, dependence (HCC) [F12.20] 06/24/2015  . Vitiligo [L80] 06/24/2015  . Pelvic pain in female [R10.2] 01/12/2015   Total Time spent with patient: 15 minutes  Past Psychiatric History: Patient was admitted at [facility] in the past in 2005 for schizoaffective do, cannabis abuse. Pt also was admitted at [facility] in 2004. Pt denies past hx of suicide attempts. Pt was referred to [facility] center in the past, but was  noncompliant.  Past Medical History: Vitilago  Family History:  Family History  Problem Relation Age of Onset  . Diabetes Paternal Grandmother    Family Psychiatric  History: Pt denies hx of mental illness, substance abuse, suicide in family  Social History: Pt is married , lives in [place] with her parents , 4 children aged 57,7,9,12. Pt reports her husband works in [place] and comes and goes . She has filed for divorce against her husband. Pt is unemployed , states her family helps out.Reports she graduated college History  Alcohol Use No     History  Drug Use  . Yes  . Special: Marijuana    Social History   Social History  . Marital Status: Married    Spouse Name: N/A  . Number of Children: N/A  . Years of Education: N/A   Social History Main Topics  . Smoking status: Never Smoker   . Smokeless tobacco: None  . Alcohol Use: No  . Drug Use: Yes    Special: Marijuana  . Sexual Activity:    Partners: Male    Birth Control/ Protection: Condom   Other Topics Concern  . None   Social History Narrative   Additional Social History:    Pain Medications: see mar Prescriptions: see mar Over the Counter: see mar History of alcohol / drug use?: No history of alcohol / drug abuse  Sleep: Fair  Appetite:  Fair  Current Medications: Current Facility-Administered Medications  Medication Dose Route Frequency Provider Last Rate Last Dose  . acetaminophen (  TYLENOL) tablet 650 mg  650 mg Oral Q6H PRN Charm Rings, NP   650 mg at 06/26/15 1809  . alum & mag hydroxide-simeth (MAALOX/MYLANTA) 200-200-20 MG/5ML suspension 30 mL  30 mL Oral Q4H PRN Charm Rings, NP      . ARIPiprazole (ABILIFY) tablet 5 mg  5 mg Oral BH-qamhs Jomarie Longs, MD   5 mg at 06/27/15 0743  . benztropine (COGENTIN) tablet 0.5 mg  0.5 mg Oral BH-qamhs Saramma Eappen, MD   0.5 mg at 06/27/15 0743  . hydrOXYzine (ATARAX/VISTARIL) tablet 25 mg  25 mg Oral Q6H PRN Kerry Hough, PA-C   25 mg at 06/25/15 2159   . lamoTRIgine (LAMICTAL) tablet 25 mg  25 mg Oral Q lunch Jomarie Longs, MD   25 mg at 06/27/15 1211  . lisinopril (PRINIVIL,ZESTRIL) tablet 10 mg  10 mg Oral Daily Sanjuana Kava, NP      . LORazepam (ATIVAN) tablet 1 mg  1 mg Oral Q6H PRN Jomarie Longs, MD   1 mg at 06/25/15 1933   Or  . LORazepam (ATIVAN) injection 1 mg  1 mg Intramuscular Q6H PRN Saramma Eappen, MD      . magnesium hydroxide (MILK OF MAGNESIA) suspension 30 mL  30 mL Oral Daily PRN Charm Rings, NP      . OLANZapine (ZYPREXA) injection 5 mg  5 mg Intramuscular Q8H PRN Jomarie Longs, MD       Or  . OLANZapine zydis (ZYPREXA) disintegrating tablet 5 mg  5 mg Oral Q8H PRN Jomarie Longs, MD   5 mg at 06/24/15 1240   Lab Results:  No results found for this or any previous visit (from the past 48 hour(s)).  Blood Alcohol level:  Lab Results  Component Value Date   ETH <5 06/23/2015   Physical Findings: AIMS: Facial and Oral Movements Muscles of Facial Expression: None, normal Lips and Perioral Area: None, normal Jaw: None, normal Tongue: None, normal,Extremity Movements Upper (arms, wrists, hands, fingers): None, normal Lower (legs, knees, ankles, toes): None, normal, Trunk Movements Neck, shoulders, hips: None, normal, Overall Severity Severity of abnormal movements (highest score from questions above): None, normal Incapacitation due to abnormal movements: None, normal Patient's awareness of abnormal movements (rate only patient's report): No Awareness, Dental Status Current problems with teeth and/or dentures?: No Does patient usually wear dentures?: No (bracelets)  CIWA:  CIWA-Ar Total: 3 COWS:  COWS Total Score: 2  Musculoskeletal: Strength & Muscle Tone: within normal limits Gait & Station: normal Patient leans: N/A  Psychiatric Specialty Exam: Review of Systems  Psychiatric/Behavioral: Positive for substance abuse. The patient is nervous/anxious.   All other systems reviewed and are negative.    Blood pressure 134/95, pulse 89, temperature 97.6 F (36.4 C), temperature source Oral, resp. rate 18, height  (1.6 m), weight 54.432 kg (120 lb), last menstrual period 06/23/2015, SpO2 100 %.Body mass index is 21.26 kg/(m^2).  General Appearance: Fairly Groomed  Patent attorney::  Fair  Speech:  Pressured improving  Volume:  Increased  Mood:  Irritable improving  Affect:  Labile and Tearful  Thought Process:  Linear  Orientation:  Full (Time, Place, and Person)  Thought Content:  Paranoid Ideation and Rumination  Suicidal Thoughts:  No  Homicidal Thoughts:  No  Memory:  Immediate;   Fair Recent;   Fair Remote;   Fair  Judgement:  Impaired  Insight:  Shallow  Psychomotor Activity:  Increased worrying  Concentration:  Fair  Recall:  Fair  [employer] of Knowledge:Fair  Language: Fair  Akathisia:  No  Handed:  Right  AIMS (if indicated):     Assets:  Desire for Improvement  ADL's:  Intact  Cognition: WNL  Sleep:  Number of Hours: 5.5   Treatment Plan Summary:Elizabeth Boyd is a 35 year old [place] female, who is married , [place] in [place] , with her parents and 4 children , is unemployed , has a past hx of schizoaffective disorder , cannabis abuse ( which she denies ) , was brought to the ED by her husband for disorganized behavior at home.Pt is improving .Will continue treatment.   Daily contact with patient to assess and evaluate symptoms and progress in treatment and Medication management: Continuer Abilify 5 mg po bhh qam and qhs for psychosis/mood sx. Will continue Cogentin 0.5 mg po bid for EPS. Will continue Lamictal 25 mg po daily for mood sx. Will make available PRN medications as per agitation protocol. Collateral information was obtained from family.  Will continue to monitor vitals ,medication compliance and treatment side effects while patient is here.  Will monitor for medical issues as well as call consult as needed.  Reviewed labs cmp - bilirubin  slightly high ,acute hepatitis panel - negative , UDS- needs ordered , TSH- wnl , lipid panel- wnl ,hba1c- pending , PL- 180. CSW will continue working on disposition.  Patient to participate in therapeutic milieu .  Continue current plan of care.  Sanjuana KavaNwoko, Agnes I, NP, PMHNP, FNP-BC 06/27/2015, 3:41 PM Agree with NP Progress Note, as above  Nehemiah MassedFernando Zali Kamaka, MD

## 2015-06-27 NOTE — [facility] Group Notes (Signed)
[facility] Group Notes: (Clinical Social Work)  06/27/2015 11:15-12:00PM  Summary of Progress/Problems: Today's process group involved patients discussing their feelings related to being hospitalized, as well as how they can avoid future hospitalizations. An exploration of key elements of wellness included medications, adherence, use of doctors, use of therapy, sleep hygiene, and a number of healthy coping skills that various patients have tried successfully. The patient stated her overall feeling about being hospitalized is "comfortable."  She did say she feels ready to go home now.  She said that it has been helpful for her to socialize with others who are similar to herself, and to not feel judged.  She interacted well throughout group, contributed several ideas about healthy coping techniques, asked others appropriate questions, and was very engaged in the topic.  Type of Therapy: Group Therapy - Process  Participation Level: Active  Participation Quality: Attentive  Affect: Appropriate  Cognitive: Appropriate  Insight: Engaged  Engagement in Therapy: Engaged  Modes of Intervention: Exploration, Discussion  Ambrose MantleMareida Grossman-Orr, LCSW 06/27/2015, 12:14 PM

## 2015-06-27 NOTE — [facility] Group Notes (Signed)
Adult Psychoeducational Group Note  Date:  06/27/2015 Time:  12:30 AM  Group Topic/Focus:  Wrap-Up Group:   The focus of this group is to help patients review their daily goal of treatment and discuss progress on daily workbooks.  Participation Level:  Did Not Attend  Participation Quality:  None  Affect:  None  Cognitive:  None  Insight: None  Engagement in Group:  None  Modes of Intervention:  Discussion  Additional Comments:  Pt did not attend group.  Caroll RancherLindsay, Shivam Mestas A 06/27/2015, 12:30 AM

## 2015-06-27 NOTE — Plan of Care (Signed)
Problem: Consults Goal: Suicide Risk Patient Education (See Patient Education module for education specifics)  Outcome: Progressing Elizabeth Boyd denies any SI and is able to express importance of maintaining med regime to help maintain mental health status.

## 2015-06-28 NOTE — Progress Notes (Signed)
Adult Psychoeducational Group Note  Date:  06/28/2015 Time:  9:13 PM  Group Topic/Focus:  Wrap-Up Group:   The focus of this group is to help patients review their daily goal of treatment and discuss progress on daily workbooks.  Participation Level:  Active  Participation Quality:  Appropriate  Affect:  Appropriate  Cognitive:  Appropriate  Insight: Appropriate  Engagement in Group:  Engaged  Modes of Intervention:  Discussion  Additional Comments: The patient expressed that she attended groups today.The patient also said that music therapy and discharge plans was discussed .  Octavio Mannshigpen, Britta Louth Lee 06/28/2015, 9:13 PM

## 2015-06-28 NOTE — Progress Notes (Signed)
DAR NOTE: Pt present with bright affect and pleasant mood in the unit. Pt has been  Visible in the dayroom interacting with peers and staff. Pt denies physical pain, took all her meds as scheduled. As per self inventory, pt had a good night sleep, good appetite, normal energy, and good concentration. Pt rate depression at 0, hopeless ness at 0, and anxiety at 0. Pt 's goal for today is " leaving, going home to family." Pt's safety ensured with 15 minute and environmental checks. Pt currently denies SI/HI and A/V hallucinations. Pt verbally agrees to seek staff if SI/HI or A/VH occurs and to consult with staff before acting on these thoughts. Will continue POC.

## 2015-06-28 NOTE — Progress Notes (Signed)
D: Patient seen watching TV and interacting with peers on day room. Denies pain, SI, AH/VH at this time. Made no new complaint. Pleasant and cooperative.  A: Support and encouragement offered as needed. Due medications given as ordered. No behavioral issues noted.  R: Patient is appropriate on unit.  Will continue to monitor patient for safety and stability.

## 2015-06-28 NOTE — Progress Notes (Signed)
Patient ID: Venia CarbonDwan D Loria-Issaka, female   DOB: [birth year], 35 y.o.   MRN: [id number] Patient ID: Venia CarbonDwan D Obie-Issaka, female   DOB: [birth year], 35 y.o.   MR[facility]: [id number] [facility]  06/28/2015 4:56 PM Jahna Darlis LoanD Clinch-Issaka  MRN:  [id number]  Subjective:  Keirston reports, "I'm doing very good. I sleep well. I have no complaints. I just want to go home tomorrow".   Objective: Erleen D Steinert-Issaka is a 35 year old [place] female, who is married, [place] in [place], with her parents and 4 children, is unemployed, has a past hx of schizoaffective disorder , cannabis abuse (which she denies), was brought to the ED by her husband for disorganized behavior at home. Fayth is seen, chart reviewed. She is alert, oriented & visible on the unit. She says she feels so good today that she just wants to go home. She says she talked to her parents & her husband last night & they were supportive. She is cooperative with staff, taking her medications. Denies any adverse effects. She presents as disheveled still. Cerita currently denies any SIHI, AVH. However, she presents as being paranoid. She wants therapy on outpatient basis after discharge.  Principal Problem: Schizoaffective disorder, bipolar type (HCC)   Diagnosis:   Patient Active Problem List   Diagnosis Date Noted  . Hyperprolactinemia (HCC) [E22.1] 06/26/2015  . Schizoaffective disorder, bipolar type (HCC) [F25.0] 06/25/2015  . Cannabis use disorder, severe, dependence (HCC) [F12.20] 06/24/2015  . Vitiligo [L80] 06/24/2015  . Pelvic pain in female [R10.2] 01/12/2015   Total Time spent with patient: 15 minutes  Past Psychiatric History: [facility] at [facility] the past in 2005 for schizoaffective do, cannabis abuse. [facility]. Pt denies past hx of suicide attempts. Pt was [facility] in [facility] past, but was noncompliant.  Past Medical History: Vitilago  Family History:  Family History  Problem  Relation Age of Onset  . Diabetes Paternal Grandmother    Family Psychiatric  History: Pt denies hx of mental illness, substance abuse, suicide in family  Social History: Pt is married , lives in [place] with her parents , 4 children aged 276,7,9,12. Pt reports her husband works in [place] and comes and goes . She has filed for divorce against her husband. Pt is unemployed , states her family helps out.Reports she graduated college History  Alcohol Use No     History  Drug Use  . Yes  . Special: Marijuana    Social History   Social History  . Marital Status: Married    Spouse Name: N/A  . Number of Children: N/A  . Years of Education: N/A   Social History Main Topics  . Smoking status: Never Smoker   . Smokeless tobacco: None  . Alcohol Use: No  . Drug Use: Yes    Special: Marijuana  . Sexual Activity:    Partners: Male    Birth Control/ Protection: Condom   Other Topics Concern  . None   Social History Narrative   Additional Social History:    Pain Medications: see mar Prescriptions: see mar Over the Counter: see mar History of alcohol / drug use?: No history of alcohol / drug abuse  Sleep: Fair  Appetite:  Fair  Current Medications: Current Facility-Administered Medications  Medication Dose Route Frequency Provider Last Rate Last Dose  . acetaminophen (TYLENOL) tablet 650 mg  650 mg Oral Q6H PRN Charm RingsJamison Y Lord, NP  650 mg at 06/26/15 1809  . alum & mag hydroxide-simeth (MAALOX/MYLANTA) 200-200-20 MG/5ML suspension 30 mL  30 mL Oral Q4H PRN Charm Rings, NP      . ARIPiprazole (ABILIFY) tablet 5 mg  5 mg Oral BH-qamhs Jomarie Longs, MD   5 mg at 06/28/15 0805  . benztropine (COGENTIN) tablet 0.5 mg  0.5 mg Oral BH-qamhs Saramma Eappen, MD   0.5 mg at 06/28/15 0805  . hydrOXYzine (ATARAX/VISTARIL) tablet 25 mg  25 mg Oral Q6H PRN Kerry Hough, PA-C   25 mg at 06/25/15 2159  . lamoTRIgine (LAMICTAL) tablet 25 mg  25 mg Oral Q lunch Jomarie Longs, MD   25 mg at  06/28/15 1308  . lisinopril (PRINIVIL,ZESTRIL) tablet 10 mg  10 mg Oral Daily Sanjuana Kava, NP   10 mg at 06/28/15 0805  . LORazepam (ATIVAN) tablet 1 mg  1 mg Oral Q6H PRN Jomarie Longs, MD   1 mg at 06/25/15 1933   Or  . LORazepam (ATIVAN) injection 1 mg  1 mg Intramuscular Q6H PRN Saramma Eappen, MD      . magnesium hydroxide (MILK OF MAGNESIA) suspension 30 mL  30 mL Oral Daily PRN Charm Rings, NP      . OLANZapine (ZYPREXA) injection 5 mg  5 mg Intramuscular Q8H PRN Jomarie Longs, MD       Or  . OLANZapine zydis (ZYPREXA) disintegrating tablet 5 mg  5 mg Oral Q8H PRN Jomarie Longs, MD   5 mg at 06/24/15 1240   Lab Results:  No results found for this or any previous visit (from the past 48 hour(s)).  Blood Alcohol level:  Lab Results  Component Value Date   ETH <5 06/23/2015   Physical Findings: AIMS: Facial and Oral Movements Muscles of Facial Expression: None, normal Lips and Perioral Area: None, normal Jaw: None, normal Tongue: None, normal,Extremity Movements Upper (arms, wrists, hands, fingers): None, normal Lower (legs, knees, ankles, toes): None, normal, Trunk Movements Neck, shoulders, hips: None, normal, Overall Severity Severity of abnormal movements (highest score from questions above): None, normal Incapacitation due to abnormal movements: None, normal Patient's awareness of abnormal movements (rate only patient's report): No Awareness, Dental Status Current problems with teeth and/or dentures?: No Does patient usually wear dentures?: No  CIWA:  CIWA-Ar Total: 3 COWS:  COWS Total Score: 2  Musculoskeletal: Strength & Muscle Tone: within normal limits Gait & Station: normal Patient leans: N/A  Psychiatric Specialty Exam: Review of Systems  Psychiatric/Behavioral: Positive for substance abuse. The patient is nervous/anxious.   All other systems reviewed and are negative.   Blood pressure 117/97, pulse 89, temperature 97.6 F (36.4 C), temperature  source Oral, resp. rate 18, height  (1.6 m), weight 54.432 kg (120 lb), last menstrual period 06/23/2015, SpO2 100 %.Body mass index is 21.26 kg/(m^2).  General Appearance: Fairly Groomed  Patent attorney::  Fair  Speech:  Pressured improving  Volume:  Increased  Mood:  Irritable improving  Affect:  Labile and Tearful  Thought Process:  Linear  Orientation:  Full (Time, Place, and Person)  Thought Content:  Paranoid Ideation and Rumination  Suicidal Thoughts:  No  Homicidal Thoughts:  No  Memory:  Immediate;   Fair Recent;   Fair Remote;   Fair  Judgement:  Impaired  Insight:  Shallow  Psychomotor Activity:  Increased worrying  Concentration:  Fair  Recall:  [employer] of Knowledge:Fair  Language: Fair  Akathisia:  No  Handed:  Right  AIMS (if indicated):     Assets:  Desire for Improvement  ADL's:  Intact  Cognition: WNL  Sleep:  Number of Hours: 5.75   Treatment Plan Summary:Chaela D Rosander-Issaka is a 35 year old [place] female, who is married , lives in [place] , with her parents and 4 children , is unemployed , has a past hx of schizoaffective disorder , cannabis abuse ( which she denies ) , was brought to the ED by her husband for disorganized behavior at home.Pt is improving .Will continue treatment.  Daily contact with patient to assess and evaluate symptoms and progress in treatment and Medication management: Continuer Abilify 5 mg po bhh qam and qhs for psychosis/mood sx. Will continue Cogentin 0.5 mg po bid for EPS. Will continue Lamictal 25 mg po daily for mood sx. Will make available PRN medications as per agitation protocol. Collateral information was obtained from family.  Will continue to monitor vitals ,medication compliance and treatment side effects while patient is here.  Will monitor for medical issues as well as call consult as needed.  Reviewed labs cmp - bilirubin slightly high ,acute hepatitis panel - negative , UDS- needs ordered , TSH- wnl ,  lipid panel- wnl ,hba1c- pending , PL- 180. CSW will continue working on disposition.  Patient to participate in therapeutic milieu .  Continue current plan of care.  Sanjuana Kava, NP, PMHNP, FNP-BC 06/28/2015, 4:56 PM

## 2015-06-28 NOTE — Progress Notes (Signed)
Patient ID: Elizabeth Boyd, female   DOB: [birth year], 35 y.o.   MRN: [id number] D: Patient reports she had a good day engaging in group activities. Pt denies SI/HI/AVH and pain. Pt attended and participated in evening wrap up group.   A: Medications administered as prescribed. Support and encouragement provided. Pt encouraged to discuss feelings and come to staff with any question or concerns.  R: Patient remains safe and complaint with medications.

## 2015-06-28 NOTE — [facility] Group Notes (Signed)
[facility] Group Notes:  (Clinical Social Work)  06/28/2015  11:00AM-12:00PM  Summary of Progress/Problems:  The main focus of today's process group was to listen to a variety of genres of music and to identify that different types of music provoke different responses.  The patient then was able to identify personally what was soothing for them, as well as energizing.  Today in group, there were many patients singing, a few dancing, and many visibly enjoyed themselves, were encouraging to others to participate. The patient expressed understanding of concepts, as well as knowledge of how each type of music affected her and how this can be used at home as a wellness/recovery tool.  She stated at the beginning of group that she is feeling quite hopeful today, and by the end of group said her hope was even higher.  At one point, she raised her hand to indicate a song was particularly intolerable for her, so it was turned off.  She immediately reconsidered, stated she wanted it to be played and she would try to appreciate it.  Afterwards, she said she could relate to it in a way that she could not when she was younger.  She was given strokes for her willingness to try something new.  Type of Therapy:  Music Therapy   Participation Level:  Active  Participation Quality:  Attentive and Sharing  Affect:  Appropriate  Cognitive:  Oriented  Insight:  Engaged  Engagement in Therapy:  Engaged  Modes of Intervention:   Activity, Exploration  Ambrose MantleMareida Grossman-Orr, LCSW 06/28/2015 2:43 PM

## 2015-06-29 MED ORDER — LAMOTRIGINE 25 MG PO TABS
25.0000 mg | ORAL_TABLET | Freq: Two times a day (BID) | ORAL | Status: DC
  Administered 2015-06-29 – 2015-06-30 (×2): 25 mg via ORAL
  Filled 2015-06-29 (×4): qty 1

## 2015-06-29 NOTE — Progress Notes (Signed)
Recreation Therapy Notes  03.06.2017 LRT met with patient to continue to investigate need for self-regulation through stress management. Patient not receptive, denying need for stress management. Patient expecting d/c tomorrow.   Laureen Ochs Juliany Daughety, LRT/CTRS   Jarvis Sawa L 06/29/2015 2:56 PM

## 2015-06-29 NOTE — Plan of Care (Signed)
Problem: Diagnosis: Increased Risk For Suicide Attempt Goal: STG-Patient Will Comply With Medication Regime Outcome: Progressing Pt compliant with medication regime     

## 2015-06-29 NOTE — [facility] Group Notes (Signed)
[facility] LCSW Group Therapy  06/29/2015 1:15 pm  Type of Therapy: Process Group Therapy  Participation Level:  Active  Participation Quality:  Appropriate  Affect:  Flat  Cognitive:  Oriented  Insight:  Improving  Engagement in Group:  Limited  Engagement in Therapy:  Limited  Modes of Intervention:  Activity, Clarification, Education, Problem-solving and Support  Summary of Progress/Problems: Today's group addressed the issue of overcoming obstacles.  Patients were asked to identify their biggest obstacle post d/c that stands in the way of their on-going success, and then problem solve as to how to manage this. Stayed the entire time.  Engaged throughout.  Talked about her willingness to "comply" with treatment [facility].  Admitted she was very angry at admission, but since then has been able to become more objective, and as a result has gotten some good help from her time here. Also talked about the importance of direct communication with others, including her spouse, and on going self assessment.  Elizabeth Boyd, Elizabeth Boyd 06/29/2015   4:47 PM

## 2015-06-29 NOTE — Progress Notes (Signed)
DAR Note: Elizabeth Boyd has been visible on the unit.  She has been attending groups.  She denies any SI/HI or A/V hallucinations.  She denies any pain or discomfort and appears to be in no physical distress.  She interacts well with peers.  Takes her medications without difficulty.  She completed her self inventory and reports that her depression, hopelessness and anxiety are 0/10.  Her goal today is "not to worry about leaving" and she will accomplish this goal by "take it easy, comply and take opportunity to rehabilitate."  Encouraged continued participation in group and unit activities.  Q 15 minute checks maintained for safety.  We will continue to monitor the progress towards her goals.

## 2015-06-29 NOTE — Progress Notes (Signed)
Patient ID: Elizabeth Boyd, female   DOB: 06-Mar-1981, 35 y.o.   MRN: [id number] D: Patient reports she had a good day engaging in group activities. Pt denies SI/HI/AVH and pain. Pt in dayroom watching TV and interacting well with peers.   A: Medications administered as prescribed. Support and encouragement provided.  R: Patient remains safe and complaint with medications.

## 2015-06-29 NOTE — Tx Team (Signed)
Interdisciplinary Treatment Plan Update (Adult) Date: 06/29/2015   Date: 06/29/2015 8:10 AM  Progress in Treatment:  Attending groups: No Participating in groups: No Taking medication as prescribed: Yes  Tolerating medication: Yes  Family/Significant other contact made: Yes, with husband Patient understands diagnosis: No, Pt minimizes symptoms and denies need for hospitalization Discussing patient identified problems/goals with staff: Yes  Medical problems stabilized or resolved: Yes  Denies suicidal/homicidal ideation: Yes Patient has not harmed self or Others: Yes   New problem(s) identified: None identified at this time.   Discharge Plan or Barriers: Pt will return home with family and follow-up with outpatient resources  Additional comments:  Patient and CSW reviewed pt's identified goals and treatment plan. Patient verbalized understanding and agreed to treatment plan. CSW reviewed [facility] "Discharge Process and Patient Involvement" Form. Pt verbalized understanding of information provided and signed form.   Will start a trial of Zyprexa zydis 2.5 mg po daily and 5 mg po qhs for psychosis/mood sx. Will add Lamictal 25 mg po daily for mood sx. Will make available PRN medications as per agitation protocol.  Reason for Continuation of Hospitalization:    Estimated length of stay: D/C today  Review of initial/current patient goals per problem list:   1.  Goal(s): Patient will participate in aftercare plan  Met:  Yes  Target date: 3-5 days from date of admission   As evidenced by: Patient will participate within aftercare plan AEB aftercare provider and housing plan at discharge being identified.   06/24/15: Pt will return home and follow-up with outpatient resources  6. Goal (s):  Patient will demonstrate decreased signs of mania . Met:  Yes . Target date: 3-5 days from date of admission  . As evidenced by:  Patient demonstrate decreased signs of mania AEB decreased mood  instability and demonstration of stable mood    -06/24/15: Pt continues to have labile mood, is intrusive, and    demonstrates  flight of ideas.   06/30/15:  No signs nor symptoms of mania today  Attendees:  Patient:    Family:    Physician: Ursula Alert  06/29/2015 8:10 AM  Nursing: Lars Pinks, RN Case manager  06/29/2015 8:10 AM  Clinical Social Worker  Rod [place] 06/29/2015 8:10 AM  Other:  06/29/2015 8:10 AM  Clinical:  RN 06/29/2015 8:10 AM  Other: , RN Charge Nurse 06/29/2015 8:10 AM  Other: Hilda Lias, [place]

## 2015-06-29 NOTE — Progress Notes (Signed)
Patient ID: Elizabeth Boyd, female   DOB: 20-Nov-1980, 35 y.o.   MRN: [id number] Patient ID: Elizabeth Boyd, female   DOB: [birth year], 35 y.o.   MRN: [id number] [facility] MD Progress Note  06/29/2015 11:50 AM Elizabeth Boyd  MRN:  [id number]  Subjective:  Elizabeth Boyd reports, "I'm fine " and patient started crying.   Objective: Elizabeth Boyd is a 35 year old [place] American female, who is married, lives in [place], with her parents and 4 children, is unemployed, has a past hx of schizoaffective disorder , cannabis abuse (which she denies), was brought to the ED by her husband for disorganized behavior at home. Elizabeth Boyd is seen, chart reviewed. She is alert, oriented & visible on the unit.  Pt states her mood is improved , but continues to be seen as tearful . Pt continues to ruminate about her husband and relationship struggles with him. Per staff - pt is less restless on the unit. Has been tolerating medications well. Will continue treatment.    Principal Problem: Schizoaffective disorder, bipolar type (HCC)   Diagnosis:   Patient Active Problem List   Diagnosis Date Noted  . Hyperprolactinemia (HCC) [E22.1] 06/26/2015  . Schizoaffective disorder, bipolar type (HCC) [F25.0] 06/25/2015  . Cannabis use disorder, severe, dependence (HCC) [F12.20] 06/24/2015  . Vitiligo [L80] 06/24/2015  . Pelvic pain in female [R10.2] 01/12/2015   Total Time spent with patient: 30 minutes  Past Psychiatric History: Patient was admitted at [facility] in the past in 2005 for schizoaffective do, cannabis abuse. Pt also was [facility] at [facility]. Pt denies past hx of suicide attempts. Pt was referred to [facility] center in the past, but was noncompliant.  Past Medical History: Vitilago  Family History:  Family History  Problem Relation Age of Onset  . Diabetes Paternal Grandmother    Family Psychiatric  History: Pt denies hx of mental illness, substance abuse, suicide in family  Social History: Pt is  married , lives in [place] with her parents , 4 children aged 71,7,9,12. Pt reports her husband works in [place] and comes and goes . She has filed for divorce against her husband. Pt is unemployed , states her family helps out.Reports she graduated college History  Alcohol Use No     History  Drug Use  . Yes  . Special: Marijuana    Social History   Social History  . Marital Status: Married    Spouse Name: N/A  . Number of Children: N/A  . Years of Education: N/A   Social History Main Topics  . Smoking status: Never Smoker   . Smokeless tobacco: None  . Alcohol Use: No  . Drug Use: Yes    Special: Marijuana  . Sexual Activity:    Partners: Male    Birth Control/ Protection: Condom   Other Topics Concern  . None   Social History Narrative   Additional Social History:    Pain Medications: see mar Prescriptions: see mar Over the Counter: see mar History of alcohol / drug use?: No history of alcohol / drug abuse  Sleep: Fair  Appetite:  Fair  Current Medications: Current Facility-Administered Medications  Medication Dose Route Frequency Provider Last Rate Last Dose  . acetaminophen (TYLENOL) tablet 650 mg  650 mg Oral Q6H PRN Charm Rings, NP   650 mg at 06/26/15 1809  . alum & mag hydroxide-simeth (MAALOX/MYLANTA) 200-200-20 MG/5ML suspension 30 mL  30 mL Oral Q4H PRN Charm Rings, NP      .  ARIPiprazole (ABILIFY) tablet 5 mg  5 mg Oral BH-qamhs Jomarie LongsSaramma Altamese Deguire, MD   5 mg at 06/29/15 0834  . benztropine (COGENTIN) tablet 0.5 mg  0.5 mg Oral BH-qamhs Jobany Montellano, MD   0.5 mg at 06/29/15 0834  . hydrOXYzine (ATARAX/VISTARIL) tablet 25 mg  25 mg Oral Q6H PRN Kerry HoughSpencer E Simon, PA-C   25 mg at 06/25/15 2159  . lamoTRIgine (LAMICTAL) tablet 25 mg  25 mg Oral BID Jomarie LongsSaramma Requan Hardge, MD      . lisinopril (PRINIVIL,ZESTRIL) tablet 10 mg  10 mg Oral Daily Sanjuana KavaAgnes I Nwoko, NP   10 mg at 06/29/15 [id number]  . LORazepam (ATIVAN) tablet 1 mg  1 mg Oral Q6H PRN Jomarie LongsSaramma Ortha Metts, MD   1 mg at  06/25/15 1933   Or  . LORazepam (ATIVAN) injection 1 mg  1 mg Intramuscular Q6H PRN Shakemia Madera, MD      . magnesium hydroxide (MILK OF MAGNESIA) suspension 30 mL  30 mL Oral Daily PRN Charm RingsJamison Y Lord, NP      . OLANZapine (ZYPREXA) injection 5 mg  5 mg Intramuscular Q8H PRN Jomarie LongsSaramma Rajah Tagliaferro, MD       Or  . OLANZapine zydis (ZYPREXA) disintegrating tablet 5 mg  5 mg Oral Q8H PRN Jomarie LongsSaramma Liliahna Cudd, MD   5 mg at 06/24/15 1240   Lab Results:  No results found for this or any previous visit (from the past 48 hour(s)).  Blood Alcohol level:  Lab Results  Component Value Date   ETH <5 06/23/2015   Physical Findings: AIMS: Facial and Oral Movements Muscles of Facial Expression: None, normal Lips and Perioral Area: None, normal Jaw: None, normal Tongue: None, normal,Extremity Movements Upper (arms, wrists, hands, fingers): None, normal Lower (legs, knees, ankles, toes): None, normal, Trunk Movements Neck, shoulders, hips: None, normal, Overall Severity Severity of abnormal movements (highest score from questions above): None, normal Incapacitation due to abnormal movements: None, normal Patient's awareness of abnormal movements (rate only patient's report): No Awareness, Dental Status Current problems with teeth and/or dentures?: No Does patient usually wear dentures?: No  CIWA:  CIWA-Ar Total: 3 COWS:  COWS Total Score: 2  Musculoskeletal: Strength & Muscle Tone: within normal limits Gait & Station: normal Patient leans: N/A  Psychiatric Specialty Exam: Review of Systems  Psychiatric/Behavioral: Positive for substance abuse. The patient is nervous/anxious.   All other systems reviewed and are negative.   Blood pressure 101/77, pulse 70, temperature 98.2 F (36.8 C), temperature source Oral, resp. rate 18, height 5\' 3"  (1.6 m), weight 54.432 kg (120 lb), last menstrual period 06/23/2015, SpO2 100 %.Body mass index is 21.26 kg/(m^2).  General Appearance: Fairly Groomed  Proofreaderye  Contact::  Fair  Speech:  Pressured improving  Volume:  Increased  Mood:  Irritable improving  Affect:  Labile and Tearful  Thought Process:  Linear  Orientation:  Full (Time, Place, and Person)  Thought Content:  Paranoid Ideation and Rumination  Suicidal Thoughts:  No  Homicidal Thoughts:  No  Memory:  Immediate;   Fair Recent;   Fair Remote;   Fair  Judgement:  Impaired  Insight:  Shallow  Psychomotor Activity:  Restlessness improving  Concentration:  Fair  Recall:  FiservFair  Fund of Knowledge:Fair  Language: Fair  Akathisia:  No  Handed:  Right  AIMS (if indicated):     Assets:  Desire for Improvement  ADL's:  Intact  Cognition: WNL  Sleep:  Number of Hours: 6.5   Treatment Plan Summary:Elizabeth Boyd is  a 35 year old [place] American female, who is married , lives in [place] , with her parents and 4 children , is unemployed , has a past hx of schizoaffective disorder , cannabis abuse ( which she denies ) , was brought to the ED by her husband for disorganized behavior at home.Pt continues to be labile , but is progressing  .Will continue treatment.  Daily contact with patient to assess and evaluate symptoms and progress in treatment and Medication management:  Continuer Abilify 5 mg po bhh qam and qhs for psychosis/mood sx.Abilify Maintena IM - if pt agrees. Will continue Cogentin 0.5 mg po bid for EPS. Will increase Lamictal 25 mg po bid  for mood sx. Will make available PRN medications as per agitation protocol. Collateral information was obtained from family.  Will continue to monitor vitals ,medication compliance and treatment side effects while patient is here.  Will monitor for medical issues as well as call consult as needed.  Reviewed labs cmp - bilirubin slightly high ,acute hepatitis panel - negative , UDS- needs ordered , TSH- wnl , lipid panel- wnl ,hba1c- pending , PL- 180. CSW will continue working on disposition.  Patient to participate in therapeutic  milieu .  Continue current plan of care.  Talar Fraley, MD 06/29/2015, 11:50 AM

## 2015-06-30 DIAGNOSIS — E221 Hyperprolactinemia: Secondary | ICD-10-CM

## 2015-06-30 MED ORDER — HYDROXYZINE HCL 25 MG PO TABS: 25.0000 mg | ORAL_TABLET | Freq: Four times a day (QID) | ORAL | Status: DC | PRN

## 2015-06-30 MED ORDER — ARIPIPRAZOLE 5 MG PO TABS: 5.0000 mg | ORAL_TABLET | ORAL | Status: DC

## 2015-06-30 MED ORDER — LAMOTRIGINE 25 MG PO TABS: 25.0000 mg | ORAL_TABLET | Freq: Two times a day (BID) | ORAL | Status: DC

## 2015-06-30 MED ORDER — LISINOPRIL 10 MG PO TABS: 10.0000 mg | ORAL_TABLET | Freq: Every day | ORAL | Status: DC

## 2015-06-30 MED ORDER — BENZTROPINE MESYLATE 0.5 MG PO TABS: 0.5000 mg | ORAL_TABLET | ORAL | Status: DC

## 2015-06-30 NOTE — [facility] Suicide Risk Assessment (Signed)
[facility]   Principal Problem: Schizoaffective disorder, bipolar type [facility](HCC) Discharge Diagnoses:  Patient Active Problem List   Diagnosis Date Noted  . Hyperprolactinemia (HCC) [E22.1] 06/26/2015  . Schizoaffective disorder, bipolar type (HCC) [F25.0] 06/25/2015  . Cannabis use disorder, severe, dependence (HCC) [F12.20] 06/24/2015  . Vitiligo [L80] 06/24/2015  . Pelvic pain in female [R10.2] 01/12/2015    Total Time spent with patient: 30 minutes  Musculoskeletal: Strength & Muscle Tone: within normal limits Gait & Station: normal Patient leans: N/A  Psychiatric Specialty Exam: Review of Systems  Psychiatric/Behavioral: Positive for substance abuse. Negative for depression, suicidal ideas and hallucinations. The patient is not nervous/anxious.   All other systems reviewed and are negative.   Blood pressure 114/80, pulse 61, temperature 98.4 F (36.9 C), temperature source Oral, resp. rate 16, height 5\' 3"  (1.6 m), weight 54.432 kg (120 lb), last menstrual period 06/23/2015, SpO2 100 %.Body mass index is 21.26 kg/(m^2).  General Appearance: Casual  Eye Contact::  Fair  Speech:  Clear and Coherent409  Volume:  Normal  Mood:  Euthymic  Affect:  Appropriate  Thought Process:  Coherent  Orientation:  Full (Time, Place, and Person)  Thought Content:  WDL  Suicidal Thoughts:  No  Homicidal Thoughts:  No  Memory:  Immediate;   Fair Recent;   Fair Remote;   Fair  Judgement:  Fair  Insight:  Fair  Psychomotor Activity:  Normal  Concentration:  Fair  Recall:  FiservFair  Fund of Knowledge:Fair  Language: Fair  Akathisia:  No  Handed:  Right  AIMS (if indicated):   0  Assets:  Desire for Improvement  Sleep:  Number of Hours: 6.5  Cognition: WNL  ADL's:  Intact   Mental Status Per Nursing Assessment::   On Admission:     Demographic Factors:  Unemployed  Loss Factors: NA  Historical Factors: Impulsivity  Risk Reduction Factors:    Responsible for children under 34 years of age and Sense of responsibility to family  Continued Clinical Symptoms:  Alcohol/Substance Abuse/Dependencies Previous Psychiatric Diagnoses and Treatments  Cognitive Features That Contribute To Risk:  Polarized thinking    Suicide Risk:  Minimal: No identifiable suicidal ideation.  Patients presenting with no risk factors but with morbid ruminations; may be classified as minimal risk based on the severity of the depressive symptoms  Follow-up Information    Follow up with [facility]. Go on 07/09/2015.   Specialty:  Family Medicine   Why:  @12p  for med management with Dr. Jeri LagerPavlock and therapy assessment with Jerolyn ShinJackie Berry   Contact information:   [address] [place] [address] [phone]       Plan Of Care/Follow-up recommendations:  Activity:  no restrictions Diet:  regular Tests:  Follow up on Prolactin level Other:  none  Romen Yutzy, MD 06/30/2015, 9:33 AM

## 2015-06-30 NOTE — Discharge Summary (Signed)
Physician Discharge Summary Note  Patient:  Elizabeth Boyd is an 35 y.o., female MRN:  [id number] DOB:  [birth year] Patient phone:  [phone] (home)  Patient address:   [address] [place] [address],  Total Time spent with patient: 30 minutes  Date of Admission:  06/23/2015 Date of Discharge:  06/30/2015  Reason for Admission: Per H&P Note-Elizabeth Boyd is a 35 year old [place] American female, who is married , lives in [place] , with her parents and 4 children , is unemployed , has a past hx of schizoaffective disorder , cannabis abuse ( which she denies ) , was brought to the ED by her husband for disorganized behavior at home.Per EHR " Pt started speaking with an African accent . Patient had flight of ideas and had to be redirected several times throughout the assessment. Patient was a poor historian. Patient denied SI/HI/Psychosis/Substance Abuse. Patient denies physical, sexual and emotional abuse. Collateral information from the patient's mother and husband Elizabeth Boyd [phone]) reports that the patient was born in the [place] and has never been out of the country. Her husband reports that she has not slept for three days. During the assessment the patient does have some tangential thought and at times what she is saying does not make sense in the context of our conversation. Collateral information from the patient's mother Elizabeth Boyd [phone]) reports that she was aggressive at home and had to be restrained by her mother and father. Per her mother the patient was, "not making sense and yelling". Patient was displaying confused, erratic and manic behaviors, per her mother.  Principal Problem: Schizoaffective disorder, bipolar type [facility]) Discharge Diagnoses: Patient Active Problem List   Diagnosis Date Noted  . Hyperprolactinemia (HCC) [E22.1] 06/26/2015  . Schizoaffective disorder, bipolar type (HCC) [F25.0] 06/25/2015  . Cannabis use disorder, severe, dependence  (HCC) [F12.20] 06/24/2015  . Vitiligo [L80] 06/24/2015  . Pelvic pain in female [R10.2] 01/12/2015    Past Psychiatric History: See Above  Past Medical History: History reviewed. No pertinent past medical history. History reviewed. No pertinent past surgical history. Family History:  Family History  Problem Relation Age of Onset  . Diabetes Paternal Grandmother    Family Psychiatric  History: See Above Social History:  History  Alcohol Use No     History  Drug Use  . Yes  . Special: Marijuana    Social History   Social History  . Marital Status: Married    Spouse Name: N/A  . Number of Children: N/A  . Years of Education: N/A   Social History Main Topics  . Smoking status: Never Smoker   . Smokeless tobacco: None  . Alcohol Use: No  . Drug Use: Yes    Special: Marijuana  . Sexual Activity:    Partners: Male    Birth Control/ Protection: Condom   Other Topics Concern  . None   Social History Narrative    Hospital Course:  Elizabeth Boyd was admitted for Schizoaffective disorder, bipolar type (HCC) ,  and crisis management.  Pt was treated discharged with the medications listed below under Medication List.  Medical problems were identified and treated as needed.  Home medications were restarted as appropriate.  Improvement was monitored by observation and Arisbeth D Heming-Issaka 's daily report of symptom reduction.  Emotional and mental status was monitored by daily self-inventory reports completed by Elizabeth Boyd and clinical staff.         Elizabeth Boyd was evaluated by the  treatment team for stability and plans for continued recovery upon discharge. Elizabeth Boyd 's motivation was an integral factor for scheduling further treatment. Employment, transportation, bed availability, health status, family support, and any pending legal issues were also considered during hospital stay. Pt was offered further treatment options upon discharge including  but not limited to Residential, Intensive Outpatient, and Outpatient treatment.  Elizabeth Boyd will follow up with the services as listed below under Follow Up Information.     Upon completion of this admission the patient was both mentally and medically stable for discharge denying suicidal/homicidal ideation, auditory/visual/tactile hallucinations, delusional thoughts and paranoia.    Elizabeth Boyd responded well to treatment with Abilify ,Lamictal and Cogentin without adverse effects.  Pt demonstrated improvement without reported or observed adverse effects to the point of stability appropriate for outpatient management. Pertinent labs include, Prolactin 180.0 (high) for which outpatient follow-up is necessary for lab recheck as mentioned below. Reviewed CBC, CMP, BAL, and UDS; all unremarkable aside from noted exceptions.   Physical Findings: AIMS: Facial and Oral Movements Muscles of Facial Expression: None, normal Lips and Perioral Area: None, normal Jaw: None, normal Tongue: None, normal,Extremity Movements Upper (arms, wrists, hands, fingers): None, normal Lower (legs, knees, ankles, toes): None, normal, Trunk Movements Neck, shoulders, hips: None, normal, Overall Severity Severity of abnormal movements (highest score from questions above): None, normal Incapacitation due to abnormal movements: None, normal Patient's awareness of abnormal movements (rate only patient's report): No Awareness, Dental Status Current problems with teeth and/or dentures?: No Does patient usually wear dentures?: No  CIWA:  CIWA-Ar Total: 3 COWS:  COWS Total Score: 2  Musculoskeletal: Strength & Muscle Tone: within normal limits Gait & Station: normal Patient leans: N/A   Psychiatric Specialty Exam: SEE SRA BY MD  Review of Systems  Psychiatric/Behavioral: Negative for suicidal ideas. Depression: stable. The patient is not nervous/anxious.        Per assessment provided by MDand Mid-level.   All other systems reviewed and are negative.   Blood pressure 114/80, pulse 61, temperature 98.4 F (36.9 C), temperature source Oral, resp. rate 16, height 5\' 3"  (1.6 m), weight 54.432 kg (120 lb), last menstrual period 06/23/2015, SpO2 100 %.Body mass index is 21.26 kg/(m^2).  Have you used any form of tobacco in the last 30 days? (Cigarettes, Smokeless Tobacco, Cigars, and/or Pipes): Yes  Has this patient used any form of tobacco in the last 30 days? (Cigarettes, Smokeless Tobacco, Cigars, and/or Pipes)No  Blood Alcohol level:  Lab Results  Component Value Date   ETH <5 06/23/2015    Metabolic Disorder Labs:  Lab Results  Component Value Date   HGBA1C 5.3 06/25/2015   MPG 105 06/25/2015   Lab Results  Component Value Date   PROLACTIN 180.0* 06/25/2015   Lab Results  Component Value Date   CHOL 154 06/25/2015   TRIG 137 06/25/2015   HDL 45 06/25/2015   CHOLHDL 3.4 06/25/2015   VLDL 27 06/25/2015   LDLCALC 82 06/25/2015   LDLCALC 101* 11/20/2013    See Psychiatric Specialty Exam and Suicide Risk Assessment completed by Attending Physician prior to discharge.  Discharge destination:  Home  Is patient on multiple antipsychotic therapies at discharge:  No   Has Patient had three or more failed trials of antipsychotic monotherapy by history:  No  Recommended Plan for Multiple Antipsychotic Therapies: NA     Medication List    TAKE these medications      Indication  ARIPiprazole 5 MG tablet  Commonly known as:  ABILIFY  Take 1 tablet (5 mg total) by mouth 2 (two) times daily in the am and at bedtime..   Indication:  mood stabilization     benztropine 0.5 MG tablet  Commonly known as:  COGENTIN  Take 1 tablet (0.5 mg total) by mouth 2 (two) times daily in the am and at bedtime..   Indication:  Extrapyramidal Reaction caused by Medications     hydrOXYzine 25 MG tablet  Commonly known as:  ATARAX/VISTARIL  Take 1 tablet (25 mg total) by mouth every 6 (six)  hours as needed for anxiety.   Indication:  Anxiety Neurosis     lamoTRIgine 25 MG tablet  Commonly known as:  LAMICTAL  Take 1 tablet (25 mg total) by mouth 2 (two) times daily.   Indication:  mood control     lisinopril 10 MG tablet  Commonly known as:  PRINIVIL,ZESTRIL  Take 1 tablet (10 mg total) by mouth daily.   Indication:  High Blood Pressure           Follow-up Information    Follow up with [facility]. Go on 07/09/2015.   Specialty:  Family Medicine   Why:   for med management with Dr. Jeri Lager and therapy assessment with Jerolyn Shin   Contact information:   [address] [place] [address] [phone]       Follow-up recommendations:  Activity:  as tolerated Diet:  heart healthy Tests:  follow-up PCP: for labs prolactin level  Comments:  Take all medications as prescribed. Keep all follow-up appointments as scheduled.  Do not consume alcohol or use illegal drugs while on prescription medications. Report any adverse effects from your medications to your primary care provider promptly.  In the event of recurrent symptoms or worsening symptoms, call 911, a crisis hotline, or go to the nearest emergency department for evaluation.   Signed: Oneta Rack, NP 06/30/2015, 1:47 PM

## 2015-06-30 NOTE — Progress Notes (Signed)
Elizabeth Boyd has been up and visible on the unit.  Attending groups and interacting with peers.  She is very supportive of her peers.  She denies any SI/HI or A/V hallucinations.  She denies any pain or discomfort and appears to be in no physical distress.  She completed her self inventory and reports that her depression, hopelessness and anxiety are 0/10.  Her goal for today is "self care" and she will accomplish this goal by "get discharged/good sleep hygiene."  Pt. D/C from the unit accompanied by husband.  She was pleasant and cooperative.   D/C follow up paperwork reviewed with pt and copy sent as well as prescriptions.  Belongings (from locker 29-black purse, cigarettes/lighter, purple/pink scarf, black scarf, green/red scarf, purple scarf and red sneakers) returned to pt. Q 15 min checks maintained until discharge.  Elizabeth Boyd left the unit in no apparent distress.

## 2015-06-30 NOTE — [facility] Group Notes (Signed)
[facility] Group Notes:  (Nursing/MHT/Case Management/Adjunct)  Date:  06/30/2015  Time:  9:30am  Type of Therapy:  Nurse Education  Participation Level:  Active  Participation Quality:  Appropriate, Attentive, Sharing and Supportive  Affect:  Appropriate  Cognitive:  Appropriate and Oriented  Insight:  Appropriate  Engagement in Group:  Engaged and Supportive  Modes of Intervention:  Discussion and Education  Summary of Progress/Problems:  Group topic today was recovery.  Discussed what recovery means to the group.  Discussed long term and short term goals as well as sleep hygiene.  Elizabeth Boyd was very talkative.  She reports that she likes doing yoga in her spare time.   Norm ParcelHeather V Jenilee Franey 06/30/2015, 11:11 AM

## 2015-06-30 NOTE — Progress Notes (Signed)
  [facility] :  Will you be returning to the same living situation after discharge:  Yes,  home At discharge, do you have transportation home?: Yes,  family Do you have the ability to pay for your medications: Yes,  MCD  Release of information consent forms completed and in the chart;  Patient's signature needed at discharge.  Patient to Follow up at: Follow-up Information    Follow up with [facility]. Go on 07/09/2015.   Specialty:  Family Medicine   Why:  @12p  for med management with Dr. Jeri LagerPavlock and therapy assessment with Jerolyn ShinJackie Berry   Contact information:   [address] [place] [id number] [phone]       Next level of care provider [facility]:no  Safety Planning and Suicide Prevention discussed: Yes,  yes  Have you used any form of tobacco in the last 30 days? (Cigarettes, Smokeless Tobacco, Cigars, and/or Pipes): Yes  Has patient been referred to the Quitline?: Patient refused referral  Patient has been referred for addiction treatment: N/A  Elizabeth Boyd, Elizabeth Boyd 06/30/2015, 10:47 AM

## 2015-09-18 ENCOUNTER — Encounter (HOSPITAL_COMMUNITY): Payer: Self-pay | Admitting: Emergency Medicine

## 2015-09-18 ENCOUNTER — Emergency Department (HOSPITAL_COMMUNITY)
Admission: EM | Admit: 2015-09-18 | Discharge: 2015-09-18 | Disposition: A | Discharge status: Home / Self Care | Payer: Medicaid Other | Attending: Emergency Medicine | Admitting: Emergency Medicine

## 2015-09-18 DIAGNOSIS — H60502 Unspecified acute noninfective otitis externa, left ear: Secondary | ICD-10-CM | POA: Diagnosis not present

## 2015-09-18 DIAGNOSIS — H6092 Unspecified otitis externa, left ear: Secondary | ICD-10-CM

## 2015-09-18 DIAGNOSIS — H9202 Otalgia, left ear: Secondary | ICD-10-CM | POA: Diagnosis present

## 2015-09-18 MED ORDER — CIPROFLOXACIN-DEXAMETHASONE 0.3-0.1 % OT SUSP: 4.0000 [drp] | Freq: Two times a day (BID) | OTIC | Status: DC

## 2015-09-18 NOTE — ED Notes (Signed)
Pt reports L ear pain for the past 2 days that has gradually gotten worse. No trouble hearing.

## 2015-09-18 NOTE — ED Provider Notes (Signed)
CSN: [id number]     Arrival date & time 09/18/15  1048 History  By signing my name below, I, Placido SouLogan Joldersma, attest that this documentation has been prepared under the direction and in the presence of Fayrene HelperBowie Melany Wiesman, PA-C. Electronically Signed: Placido SouLogan Joldersma, ED Scribe. 09/18/2015. 12:25 PM.   Chief Complaint  Patient presents with  . Otalgia   The history is provided by the patient. No language interpreter was used.    HPI Comments: Elizabeth Boyd is a 35 y.o. female who presents to the Emergency Department complaining of worsening, 7/10, gradual onset, left ear pain x 3 days. She states that it began as an irritation and became a throbbing pain with 2-3 x dizziness which has since alleviated. Pt states that she participates in a water aerobics class and washes her hair regularly. She has taken Advil without significant relief. Pt denies any known allergies. She denies ear drainage, tinnitus, hearing loss, fever, chills, sneezing and cough.   History reviewed. No pertinent past medical history. History reviewed. No pertinent past surgical history. Family History  Problem Relation Age of Onset  . Diabetes Paternal Grandmother    Social History  Substance Use Topics  . Smoking status: Never Smoker   . Smokeless tobacco: None  . Alcohol Use: No   OB History    Gravida Para Term Preterm AB TAB SAB Ectopic Multiple Living   4 4 4       4      Review of Systems  Constitutional: Negative for fever and chills.  HENT: Positive for ear pain. Negative for congestion, ear discharge, hearing loss, sneezing and tinnitus.   Respiratory: Negative for cough.   Neurological: Negative for dizziness.   Allergies  Review of patient's allergies indicates no known allergies.  Home Medications   Prior to Admission medications   Medication Sig Start Date End Date Taking? Authorizing Provider  ARIPiprazole (ABILIFY) 5 MG tablet Take 1 tablet (5 mg total) by mouth 2 (two) times daily in the am and at  bedtime.. 06/30/15   Adonis BrookSheila Agustin, NP  benztropine (COGENTIN) 0.5 MG tablet Take 1 tablet (0.5 mg total) by mouth 2 (two) times daily in the am and at bedtime.. 06/30/15   Adonis BrookSheila Agustin, NP  hydrOXYzine (ATARAX/VISTARIL) 25 MG tablet Take 1 tablet (25 mg total) by mouth every 6 (six) hours as needed for anxiety. 06/30/15   Adonis BrookSheila Agustin, NP  lamoTRIgine (LAMICTAL) 25 MG tablet Take 1 tablet (25 mg total) by mouth 2 (two) times daily. 06/30/15   Adonis BrookSheila Agustin, NP  lisinopril (PRINIVIL,ZESTRIL) 10 MG tablet Take 1 tablet (10 mg total) by mouth daily. 06/30/15   Adonis BrookSheila Agustin, NP   BP 113/73 mmHg  Pulse 65  Temp(Src) 98.6 F (37 C) (Oral)  Resp 18  SpO2 100%    Physical Exam  Constitutional: She is oriented to person, place, and time. She appears well-developed and well-nourished. No distress.  African [place] female sitting upright, comfortably and in NAD  HENT:  Head: Normocephalic and atraumatic.  Right Ear: Tympanic membrane, external ear and ear canal normal.  Left Ear: Tympanic membrane is erythematous.  Left Ear: TM mildly erythematous; no perforated TM; ear canal inflamed but not significantly edematous; TTP to ear lobe and tragus; no evidence of mastoiditis; no TMJ  Eyes: EOM are normal.  Neck: Normal range of motion.  Cardiovascular: Normal rate.   Pulmonary/Chest: Effort normal. No respiratory distress.  Abdominal: Soft.  Musculoskeletal: Normal range of motion.  Neurological: She is  alert and oriented to person, place, and time.  Skin: Skin is warm and dry. She is not diaphoretic.  Psychiatric: She has a normal mood and affect.  Nursing note and vitals reviewed.   ED Course  Procedures  DIAGNOSTIC STUDIES: Oxygen Saturation is 100% on RA, normal by my interpretation.    COORDINATION OF CARE: 12:23 PM Discussed next steps with pt including Ciprodex. Pt verbalized understanding and is agreeable with the plan.     MDM   Final diagnoses:  Otitis externa, acute, left    BP 113/73 mmHg  Pulse 65  Temp(Src) 98.6 F (37 C) (Oral)  Resp 18  SpO2 100%   Pt presenting with otitis externa. Pt afebrile in NAD. Exam not concerning for mastoiditis, cellulitis or malignant OE. Discharge with ciprodex.  Advised follow up with ENT in 2-3 days if no improvement.  Return precautions discussed. Pt appears safe for discharge.  I personally performed the services described in this documentation, which was scribed in my presence. The recorded information has been reviewed and is accurate.      Fayrene Helper, PA-C 09/18/15 1228  Nelva Nay, MD 09/20/15 [phone]

## 2015-09-18 NOTE — Discharge Instructions (Signed)
Otitis Externa °Otitis externa is a germ infection in the outer ear. The outer ear is the area from the eardrum to the outside of the ear. Otitis externa is sometimes called "swimmer's ear." °HOME CARE °· Put drops in the ear as told by your doctor. °· Only take medicine as told by your doctor. °· If you have diabetes, your doctor may give you more directions. Follow your doctor's directions. °· Keep all doctor visits as told. °To avoid another infection: °· Keep your ear dry. Use the corner of a towel to dry your ear after swimming or bathing. °· Avoid scratching or putting things inside your ear. °· Avoid swimming in lakes, dirty water, or pools that use a chemical called chlorine poorly. °· You may use ear drops after swimming. Combine equal amounts of white vinegar and alcohol in a bottle. Put 3 or 4 drops in each ear. °GET HELP IF:  °· You have a fever. °· Your ear is still red, puffy (swollen), or painful after 3 days. °· You still have yellowish-white fluid (pus) coming from the ear after 3 days. °· Your redness, puffiness, or pain gets worse. °· You have a really bad headache. °· You have redness, puffiness, pain, or tenderness behind your ear. °MAKE SURE YOU:  °· Understand these instructions. °· Will watch your condition. °· Will get help right away if you are not doing well or get worse. °  °This information is not intended to replace advice given to you by your health care provider. Make sure you discuss any questions you have with your health care provider. °  °Document Released: 09/28/2007 Document Revised: 05/02/2014 Document Reviewed: 04/28/2011 °Elsevier Interactive Patient Education ©2016 [employer]vier [employer] ° °Ear Drops, Adult °You need to put eardrops in your ear. °HOME CARE  °· Put drops in your affected ear as told. °· After putting in the drops, lie down with the ear you put the drops in facing up. Stay this way for 10 minutes. Use the ear drops as long as your doctor tells you. °· Before you get  up, put a cotton ball gently in your ear. Do not push it far in your ear. °· Do not wash out your ears unless your doctor says it is okay. °· Finish all medicines as told by your doctor. You may be told to keep using the eardrops even if you start to feel better. °· See your doctor as told for follow-up visits. °GET HELP IF: °· You have pain that gets worse. °· Any unusual fluid (drainage) is coming from your ear (especially if the fluid stinks). °· You have trouble hearing. °· You get really dizzy as if the room is spinning and feel sick to your stomach (vertigo). °· The outside of your ear becomes red or puffy or both. This may be a sign of an allergic reaction. °MAKE SURE YOU:  °· Understand these instructions. °· Will watch your condition. °· Will get help right away if you are not doing well or get worse. °  °This information is not intended to replace advice given to you by your health care provider. Make sure you discuss any questions you have with your health care provider. °  °Document Released: 09/29/2009 Document Revised: 05/02/2014 Document Reviewed: 11/06/2012 °Elsevier Interactive Patient Education ©2016 Elsevier Inc. ° °

## 2015-09-18 NOTE — ED Notes (Signed)
PT DISCHARGED. INSTRUCTIONS AND PRESCRIPTION GIVEN. AAOX4. PT IN NO APPARENT DISTRESS. THE OPPORTUNITY TO ASK QUESTIONS WAS PROVIDED. 

## 2016-08-23 ENCOUNTER — Encounter: Payer: Self-pay | Admitting: Obstetrics

## 2016-08-23 ENCOUNTER — Ambulatory Visit (INDEPENDENT_AMBULATORY_CARE_PROVIDER_SITE_OTHER): Payer: Medicaid Other | Admitting: Obstetrics

## 2016-08-23 ENCOUNTER — Other Ambulatory Visit (HOSPITAL_COMMUNITY)
Admission: RE | Admit: 2016-08-23 | Discharge: 2016-08-23 | Disposition: A | Discharge status: Home / Self Care | Payer: Medicaid Other | Source: Ambulatory Visit | Attending: Obstetrics | Admitting: Obstetrics

## 2016-08-23 VITALS — BP 91/58 | HR 51 | Ht 62.0 in | Wt 119.8 lb

## 2016-08-23 DIAGNOSIS — Z0000 Encounter for general adult medical examination without abnormal findings: Secondary | ICD-10-CM

## 2016-08-23 DIAGNOSIS — N898 Other specified noninflammatory disorders of vagina: Secondary | ICD-10-CM

## 2016-08-23 DIAGNOSIS — Z3009 Encounter for other general counseling and advice on contraception: Secondary | ICD-10-CM

## 2016-08-23 DIAGNOSIS — Z01419 Encounter for gynecological examination (general) (routine) without abnormal findings: Secondary | ICD-10-CM | POA: Insufficient documentation

## 2016-08-23 DIAGNOSIS — M62838 Other muscle spasm: Secondary | ICD-10-CM

## 2016-08-23 DIAGNOSIS — Z124 Encounter for screening for malignant neoplasm of cervix: Secondary | ICD-10-CM

## 2016-08-23 MED ORDER — IBUPROFEN 800 MG PO TABS: 800.0000 mg | ORAL_TABLET | Freq: Three times a day (TID) | ORAL | 5 refills | Status: DC | PRN

## 2016-08-23 MED ORDER — CYCLOBENZAPRINE HCL 5 MG PO TABS: 5.0000 mg | ORAL_TABLET | Freq: Three times a day (TID) | ORAL | 1 refills | Status: DC | PRN

## 2016-08-23 MED ORDER — PNV PRENATAL PLUS MULTIVITAMIN 27-1 MG PO TABS: 1.0000 | ORAL_TABLET | Freq: Every day | ORAL | 11 refills | Status: DC

## 2016-08-23 MED ORDER — CYCLOBENZAPRINE HCL 5 MG PO TABS: 1.0000 mg | ORAL_TABLET | Freq: Three times a day (TID) | ORAL | 1 refills | Status: DC | PRN

## 2016-08-23 NOTE — Progress Notes (Signed)
Subjective:        Elizabeth Boyd is a 36 y.o. female here for a routine exam.  Current complaints: Backache.    Personal health questionnaire:  Is patient [name], have a family history of breast and/or ovarian cancer: no Is there a family history of uterine cancer diagnosed at age < 17, gastrointestinal cancer, urinary tract cancer, family member who is a Personnel officer syndrome-associated carrier: no Is the patient overweight and hypertensive, family history of diabetes, personal history of gestational diabetes, preeclampsia or PCOS: no Is patient over 27, have PCOS,  family history of premature CHD under age 35, diabetes, smoke, have hypertension or peripheral artery disease:  no At any time, has a partner hit, kicked or otherwise hurt or frightened you?: no Over the past 2 weeks, have you felt down, depressed or hopeless?: no Over the past 2 weeks, have you felt little interest or pleasure in doing things?:no   Gynecologic History Patient's last menstrual period was 08/13/2016. Contraception: abstinence Last Pap: 2017. Results were: normal Last mammogram: n/a. Results were: n/a  Obstetric History OB History  Gravida Para Term Preterm AB Living  SAB TAB Ectopic Multiple Live Births          4    # Outcome Date GA Lbr Len/2nd Weight Sex Delivery Anes PTL Lv  4 Term         LIV  3 Term         LIV  2 Term         LIV  1 Term         LIV      History reviewed. No pertinent past medical history.  History reviewed. No pertinent surgical history.   Current Outpatient Prescriptions:  .  ARIPiprazole (ABILIFY) 5 MG tablet, Take 1 tablet (5 mg total) by mouth 2 (two) times daily in the am and at bedtime.. (Patient not taking: Reported on 08/23/2016), Disp: 60 tablet, Rfl: 0 .  benztropine (COGENTIN) 0.5 MG tablet, Take 1 tablet (0.5 mg total) by mouth 2 (two) times daily in the am and at bedtime.. (Patient not taking: Reported on 08/23/2016), Disp: 60 tablet, Rfl:  0 .  ciprofloxacin-dexamethasone (CIPRODEX) otic suspension, Place 4 drops into the left ear 2 (two) times daily. (Patient not taking: Reported on 08/23/2016), Disp: 7.5 mL, Rfl: 0 .  cyclobenzaprine (FLEXERIL) 5 MG tablet, Take 1 tablet (5 mg total) by mouth 3 (three) times daily as needed for muscle spasms., Disp: 30 tablet, Rfl: 1 .  hydrOXYzine (ATARAX/VISTARIL) 25 MG tablet, Take 1 tablet (25 mg total) by mouth every 6 (six) hours as needed for anxiety. (Patient not taking: Reported on 08/23/2016), Disp: 30 tablet, Rfl: 0 .  ibuprofen (ADVIL,MOTRIN) 800 MG tablet, Take 1 tablet (800 mg total) by mouth every 8 (eight) hours as needed., Disp: 30 tablet, Rfl: 5 .  lamoTRIgine (LAMICTAL) 25 MG tablet, Take 1 tablet (25 mg total) by mouth 2 (two) times daily. (Patient not taking: Reported on 08/23/2016), Disp: 60 tablet, Rfl: 0 .  lisinopril (PRINIVIL,ZESTRIL) 10 MG tablet, Take 1 tablet (10 mg total) by mouth daily. (Patient not taking: Reported on 08/23/2016), Disp: 30 tablet, Rfl: 0 .  Prenatal Vit-Fe Fumarate-FA (PNV PRENATAL PLUS MULTIVITAMIN) 27-1 MG TABS, Take 1 tablet by mouth daily before breakfast., Disp: 30 tablet, Rfl: 11 No Known Allergies  Social History  Substance Use Topics  . Smoking status: Never Smoker  .  Smokeless tobacco: Not on file  . Alcohol use No    Family History  Problem Relation Age of Onset  . Diabetes Paternal Grandmother       Review of Systems  Constitutional: negative for fatigue and weight loss Respiratory: negative for cough and wheezing Cardiovascular: negative for chest pain, fatigue and palpitations Gastrointestinal: negative for abdominal pain and change in bowel habits Musculoskeletal:positive for myalgias - Backache Neurological: negative for gait problems and tremors Behavioral/Psych: negative for abusive relationship, depression Endocrine: negative for temperature intolerance    Genitourinary:negative for abnormal menstrual periods, genital lesions,  hot flashes, sexual problems and vaginal discharge Integument/breast: negative for breast lump, breast tenderness, nipple discharge and skin lesion(s)    Objective:       BP (!) 91/58   Pulse (!) 51   Ht  (1.575 m)   Wt 119 lb 12.8 oz (54.3 kg)   LMP 08/13/2016   BMI 21.91 kg/m  General:   alert  Skin:   no rash or abnormalities  Lungs:   clear to auscultation bilaterally  Heart:   regular rate and rhythm, S1, S2 normal, no murmur, click, rub or gallop  Breasts:   normal without suspicious masses, skin or nipple changes or axillary nodes  Abdomen:  normal findings: no organomegaly, soft, non-tender and no hernia  Pelvis:  External genitalia: normal general appearance Urinary system: urethral meatus normal and bladder without fullness, nontender Vaginal: normal without tenderness, induration or masses Cervix: normal appearance Adnexa: normal bimanual exam Uterus: anteverted and non-tender, normal size   Lab Review Urine pregnancy test Labs reviewed yes Radiologic studies reviewed no  50% of 20 min visit spent on counseling and coordination of care.    Assessment:    Healthy female exam.    Backache   Plan:    Flexeril / Ibuprofen Rx  Education reviewed: calcium supplements, depression evaluation, low fat, low cholesterol diet, safe sex/STD prevention, self breast exams and weight bearing exercise. Contraception: abstinence. Follow up in: 1 year.   Meds ordered this encounter  Medications  . DISCONTD: cyclobenzaprine (FLEXERIL) 5 MG tablet    Sig: Take 0.5 tablets (2.5 mg total) by mouth 3 (three) times daily as needed for muscle spasms.    Dispense:  30 tablet    Refill:  1  . ibuprofen (ADVIL,MOTRIN) 800 MG tablet    Sig: Take 1 tablet (800 mg total) by mouth every 8 (eight) hours as needed.    Dispense:  30 tablet    Refill:  5  . Prenatal Vit-Fe Fumarate-FA (PNV PRENATAL PLUS MULTIVITAMIN) 27-1 MG TABS    Sig: Take 1 tablet by mouth daily before  breakfast.    Dispense:  30 tablet    Refill:  11  . cyclobenzaprine (FLEXERIL) 5 MG tablet    Sig: Take 1 tablet (5 mg total) by mouth 3 (three) times daily as needed for muscle spasms.    Dispense:  30 tablet    Refill:  1   Orders Placed This Encounter  Procedures  . STD Panel (HBSAG,HIV,RPR)

## 2016-08-23 NOTE — Progress Notes (Signed)
Patient is in the office for annual exam, last pap 2015. Pt requests std testing with lab panel included.

## 2016-08-25 LAB — CERVICOVAGINAL ANCILLARY ONLY
BACTERIAL VAGINITIS: NEGATIVE
CANDIDA VAGINITIS: NEGATIVE
CHLAMYDIA, DNA PROBE: NEGATIVE
NEISSERIA GONORRHEA: NEGATIVE
TRICH (WINDOWPATH): NEGATIVE

## 2016-08-26 LAB — CYTOLOGY - PAP
DIAGNOSIS: NEGATIVE
HPV (WINDOPATH): NOT DETECTED

## 2017-07-27 ENCOUNTER — Other Ambulatory Visit (HOSPITAL_COMMUNITY)
Admission: RE | Admit: 2017-07-27 | Discharge: 2017-07-27 | Disposition: A | Discharge status: Home / Self Care | Payer: Medicaid Other | Source: Ambulatory Visit | Attending: Obstetrics | Admitting: Obstetrics

## 2017-07-27 ENCOUNTER — Ambulatory Visit: Payer: Medicaid Other | Admitting: Obstetrics

## 2017-07-27 ENCOUNTER — Encounter: Payer: Self-pay | Admitting: Obstetrics

## 2017-07-27 VITALS — BP 100/67 | HR 67 | Ht 62.0 in | Wt 128.8 lb

## 2017-07-27 DIAGNOSIS — Z3009 Encounter for other general counseling and advice on contraception: Secondary | ICD-10-CM

## 2017-07-27 DIAGNOSIS — Z01419 Encounter for gynecological examination (general) (routine) without abnormal findings: Secondary | ICD-10-CM | POA: Diagnosis not present

## 2017-07-27 DIAGNOSIS — Z113 Encounter for screening for infections with a predominantly sexual mode of transmission: Secondary | ICD-10-CM

## 2017-07-27 DIAGNOSIS — N898 Other specified noninflammatory disorders of vagina: Secondary | ICD-10-CM

## 2017-07-27 DIAGNOSIS — J301 Allergic rhinitis due to pollen: Secondary | ICD-10-CM

## 2017-07-27 DIAGNOSIS — M62838 Other muscle spasm: Secondary | ICD-10-CM

## 2017-07-27 DIAGNOSIS — Z0000 Encounter for general adult medical examination without abnormal findings: Secondary | ICD-10-CM

## 2017-07-27 MED ORDER — PRENATAL PLUS IRON 29-1 MG PO TABS: 1.0000 | ORAL_TABLET | Freq: Every day | ORAL | 11 refills | Status: DC

## 2017-07-27 MED ORDER — IBUPROFEN 800 MG PO TABS: 800.0000 mg | ORAL_TABLET | Freq: Three times a day (TID) | ORAL | 5 refills | Status: DC | PRN

## 2017-07-27 MED ORDER — LORATADINE 10 MG PO TABS: 10.0000 mg | ORAL_TABLET | Freq: Every day | ORAL | 11 refills | Status: DC

## 2017-07-27 NOTE — Progress Notes (Signed)
Subjective:        Elizabeth Boyd is a 37 y.o. female here for a routine exam.  Current complaints: None.    Personal health questionnaire:  Is patient Elizabeth Boyd, have a family history of breast and/or ovarian cancer: no Is there a family history of uterine cancer diagnosed at age < 65, gastrointestinal cancer, urinary tract cancer, family member who is a Personnel officer syndrome-associated carrier: no Is the patient overweight and hypertensive, family history of diabetes, personal history of gestational diabetes, preeclampsia or PCOS: no Is patient over 22, have PCOS,  family history of premature CHD under age 39, diabetes, smoke, have hypertension or peripheral artery disease:  no At any time, has a partner hit, kicked or otherwise hurt or frightened you?: no Over the past 2 weeks, have you felt down, depressed or hopeless?: no Over the past 2 weeks, have you felt little interest or pleasure in doing things?:no   Gynecologic History Patient's last menstrual period was 07/05/2017. Contraception: abstinence Last Pap: 2018. Results were: normal Last mammogram: n/a. Results were: n/a  Obstetric History OB History  Gravida Para Term Preterm AB Living  4 4 4     4   SAB TAB Ectopic Multiple Live Births          4    # Outcome Date GA Lbr Len/2nd Weight Sex Delivery Anes PTL Lv  4 Term 03/09/09     Vag-Spont   LIV  3 Term 07/21/07     Vag-Spont   LIV  2 Term 08/21/05     Vag-Spont   LIV  1 Term 12/29/00     Vag-Spont   LIV    History reviewed. No pertinent past medical history.  History reviewed. No pertinent surgical history.   Current Outpatient Medications:  .  ARIPiprazole (ABILIFY) 5 MG tablet, Take 1 tablet (5 mg total) by mouth 2 (two) times daily in the am and at bedtime.. (Patient not taking: Reported on 08/23/2016), Disp: 60 tablet, Rfl: 0 .  benztropine (COGENTIN) 0.5 MG tablet, Take 1 tablet (0.5 mg total) by mouth 2 (two) times daily in the am and at bedtime.. (Patient  not taking: Reported on 08/23/2016), Disp: 60 tablet, Rfl: 0 .  ciprofloxacin-dexamethasone (CIPRODEX) otic suspension, Place 4 drops into the left ear 2 (two) times daily. (Patient not taking: Reported on 08/23/2016), Disp: 7.5 mL, Rfl: 0 .  cyclobenzaprine (FLEXERIL) 5 MG tablet, Take 1 tablet (5 mg total) by mouth 3 (three) times daily as needed for muscle spasms. (Patient not taking: Reported on 07/27/2017), Disp: 30 tablet, Rfl: 1 .  hydrOXYzine (ATARAX/VISTARIL) 25 MG tablet, Take 1 tablet (25 mg total) by mouth every 6 (six) hours as needed for anxiety. (Patient not taking: Reported on 08/23/2016), Disp: 30 tablet, Rfl: 0 .  ibuprofen (ADVIL,MOTRIN) 800 MG tablet, Take 1 tablet (800 mg total) by mouth every 8 (eight) hours as needed., Disp: 30 tablet, Rfl: 5 .  lamoTRIgine (LAMICTAL) 25 MG tablet, Take 1 tablet (25 mg total) by mouth 2 (two) times daily. (Patient not taking: Reported on 08/23/2016), Disp: 60 tablet, Rfl: 0 .  lisinopril (PRINIVIL,ZESTRIL) 10 MG tablet, Take 1 tablet (10 mg total) by mouth daily. (Patient not taking: Reported on 08/23/2016), Disp: 30 tablet, Rfl: 0 .  loratadine (CLARITIN) 10 MG tablet, Take 1 tablet (10 mg total) by mouth daily., Disp: 30 tablet, Rfl: 11 .  Prenatal Vit-Fe Fumarate-FA (PNV PRENATAL PLUS MULTIVITAMIN) 27-1 MG TABS, Take 1 tablet by mouth  daily before breakfast. (Patient not taking: Reported on 07/27/2017), Disp: 30 tablet, Rfl: 11 .  Prenatal Vit-Iron Carbonyl-FA (PRENATAL PLUS IRON) 29-1 MG TABS, Take 1 tablet by mouth daily before breakfast., Disp: 30 tablet, Rfl: 11 No Known Allergies  Social History   Tobacco Use  . Smoking status: Never Smoker  . Smokeless tobacco: Never Used  Substance Use Topics  . Alcohol use: No    Alcohol/week: 0.0 oz    Family History  Problem Relation Age of Onset  . Diabetes Paternal Grandmother       Review of Systems  Constitutional: negative for fatigue and weight loss Respiratory: negative for cough and  wheezing Cardiovascular: negative for chest pain, fatigue and palpitations Gastrointestinal: negative for abdominal pain and change in bowel habits Musculoskeletal:negative for myalgias Neurological: negative for gait problems and tremors Behavioral/Psych: negative for abusive relationship, depression Endocrine: negative for temperature intolerance    Genitourinary:negative for abnormal menstrual periods, genital lesions, hot flashes, sexual problems and vaginal discharge Integument/breast: negative for breast lump, breast tenderness, nipple discharge and skin lesion(s)    Objective:       BP 100/67   Pulse 67   Ht 5\' 2"  (1.575 m)   Wt 128 lb 12.8 oz (58.4 kg)   LMP 07/05/2017   BMI 23.56 kg/m  General:   alert  Skin:   no rash or abnormalities  Lungs:   clear to auscultation bilaterally  Heart:   regular rate and rhythm, S1, S2 normal, no murmur, click, rub or gallop  Breasts:   normal without suspicious masses, skin or nipple changes or axillary nodes  Abdomen:  normal findings: no organomegaly, soft, non-tender and no hernia  Pelvis:  External genitalia: normal general appearance Urinary system: urethral meatus normal and bladder without fullness, nontender Vaginal: normal without tenderness, induration or masses Cervix: normal appearance Adnexa: normal bimanual exam Uterus: anteverted and non-tender, normal size   Lab Review Urine pregnancy test Labs reviewed yes Radiologic studies reviewed no  50% of 20 min visit spent on counseling and coordination of care.   Assessment:   1. Encounter for routine gynecological examination with Papanicolaou smear of cervix Rx: - Cytology - PAP  2. Vaginal discharge Rx: - Cervicovaginal ancillary only  3. Encounter for other general counseling or advice on contraception - declines contraception.  She is abstinent.  4. Screen for STD (sexually transmitted disease) Rx: - HIV antibody (with reflex) - Hepatitis B Surface  AntiGEN - Hepatitis C Antibody - RPR  5. Seasonal allergic rhinitis due to pollen Rx: - loratadine (CLARITIN) 10 MG tablet; Take 1 tablet (10 mg total) by mouth daily.  Dispense: 30 tablet; Refill: 11  6. Muscle spasm Rx: - ibuprofen (ADVIL,MOTRIN) 800 MG tablet; Take 1 tablet (800 mg total) by mouth every 8 (eight) hours as needed.  Dispense: 30 tablet; Refill: 5  7. Routine adult health maintenance Rx: - CBC - TSH - Comprehensive metabolic panel - Prenatal Vit-Iron Carbonyl-FA (PRENATAL PLUS IRON) 29-1 MG TABS; Take 1 tablet by mouth daily before breakfast.  Dispense: 30 tablet; Refill: 11     Plan:    Education reviewed: calcium supplements, depression evaluation, low fat, low cholesterol diet, safe sex/STD prevention, self breast exams and weight bearing exercise. Contraception: abstinence. Follow up in: 1 year.   Meds ordered this encounter  Medications  . loratadine (CLARITIN) 10 MG tablet    Sig: Take 1 tablet (10 mg total) by mouth daily.    Dispense:  30 tablet  Refill:  11  . ibuprofen (ADVIL,MOTRIN) 800 MG tablet    Sig: Take 1 tablet (800 mg total) by mouth every 8 (eight) hours as needed.    Dispense:  30 tablet    Refill:  5  . Prenatal Vit-Iron Carbonyl-FA (PRENATAL PLUS IRON) 29-1 MG TABS    Sig: Take 1 tablet by mouth daily before breakfast.    Dispense:  30 tablet    Refill:  11   Orders Placed This Encounter  Procedures  . HIV antibody (with reflex)  . Hepatitis B Surface AntiGEN  . Hepatitis C Antibody  . RPR  . CBC  . TSH  . Comprehensive metabolic panel    Brock BadHARLES A. HARPER MD 07-27-2017

## 2017-07-28 LAB — CYTOLOGY - PAP
Diagnosis: NEGATIVE
HPV (WINDOPATH): NOT DETECTED

## 2017-07-28 LAB — COMPREHENSIVE METABOLIC PANEL
ALK PHOS: 59 IU/L (ref 39–117)
ALT: 15 IU/L (ref 0–32)
AST: 24 IU/L (ref 0–40)
Albumin/Globulin Ratio: 1.7 (ref 1.2–2.2)
Albumin: 4.4 g/dL (ref 3.5–5.5)
BILIRUBIN TOTAL: 0.3 mg/dL (ref 0.0–1.2)
BUN/Creatinine Ratio: 11 (ref 9–23)
BUN: 9 mg/dL (ref 6–20)
CHLORIDE: 102 mmol/L (ref 96–106)
CO2: 21 mmol/L (ref 20–29)
CREATININE: 0.81 mg/dL (ref 0.57–1.00)
Calcium: 9.2 mg/dL (ref 8.7–10.2)
GFR calc Af Amer: 109 mL/min/{1.73_m2} (ref >59)
GFR calc non Af Amer: 94 mL/min/{1.73_m2} (ref >59)
GLOBULIN, TOTAL: 2.6 g/dL (ref 1.5–4.5)
GLUCOSE: 76 mg/dL (ref 65–99)
Potassium: 3.6 mmol/L (ref 3.5–5.2)
SODIUM: 139 mmol/L (ref 134–144)
Total Protein: 7 g/dL (ref 6.0–8.5)

## 2017-07-28 LAB — CBC
Hematocrit: 39.9 % (ref 34.0–46.6)
Hemoglobin: 13 g/dL (ref 11.1–15.9)
MCH: 29.7 pg (ref 26.6–33.0)
MCHC: 32.6 g/dL (ref 31.5–35.7)
MCV: 91 fL (ref 79–97)
Platelets: 200 10*3/uL (ref 150–379)
RBC: 4.37 x10E6/uL (ref 3.77–5.28)
RDW: 13.4 % (ref 12.3–15.4)
WBC: 5.5 10*3/uL (ref 3.4–10.8)

## 2017-07-28 LAB — CERVICOVAGINAL ANCILLARY ONLY
Bacterial vaginitis: NEGATIVE
Candida vaginitis: NEGATIVE
Chlamydia: NEGATIVE
Neisseria Gonorrhea: NEGATIVE
Trichomonas: NEGATIVE

## 2017-07-28 LAB — RPR: RPR: NONREACTIVE

## 2017-07-28 LAB — TSH: TSH: 0.682 u[IU]/mL (ref 0.450–4.500)

## 2017-07-28 LAB — HEPATITIS C ANTIBODY: Hep C Virus Ab: <0.1 s/co ratio (ref 0.0–0.9)

## 2017-07-28 LAB — HIV ANTIBODY (ROUTINE TESTING W REFLEX): HIV SCREEN 4TH GENERATION: NONREACTIVE

## 2017-07-28 LAB — HEPATITIS B SURFACE ANTIGEN: HEP B S AG: NEGATIVE

## 2018-10-31 ENCOUNTER — Ambulatory Visit: Payer: Medicaid Other | Admitting: Obstetrics

## 2018-10-31 ENCOUNTER — Other Ambulatory Visit: Payer: Self-pay

## 2018-10-31 ENCOUNTER — Encounter: Payer: Self-pay | Admitting: Obstetrics

## 2018-10-31 ENCOUNTER — Other Ambulatory Visit (HOSPITAL_COMMUNITY)
Admission: RE | Admit: 2018-10-31 | Discharge: 2018-10-31 | Disposition: A | Discharge status: Home / Self Care | Payer: Medicaid Other | Source: Ambulatory Visit | Attending: Obstetrics | Admitting: Obstetrics

## 2018-10-31 VITALS — BP 108/72 | HR 64 | Ht 62.0 in | Wt 155.0 lb

## 2018-10-31 DIAGNOSIS — Z0000 Encounter for general adult medical examination without abnormal findings: Secondary | ICD-10-CM | POA: Diagnosis not present

## 2018-10-31 DIAGNOSIS — Z01419 Encounter for gynecological examination (general) (routine) without abnormal findings: Secondary | ICD-10-CM

## 2018-10-31 DIAGNOSIS — Z113 Encounter for screening for infections with a predominantly sexual mode of transmission: Secondary | ICD-10-CM | POA: Diagnosis not present

## 2018-10-31 DIAGNOSIS — M62838 Other muscle spasm: Secondary | ICD-10-CM

## 2018-10-31 DIAGNOSIS — N898 Other specified noninflammatory disorders of vagina: Secondary | ICD-10-CM

## 2018-10-31 MED ORDER — IBUPROFEN 800 MG PO TABS: 800.0000 mg | ORAL_TABLET | Freq: Three times a day (TID) | ORAL | 5 refills | Status: DC | PRN

## 2018-10-31 MED ORDER — PRENATAL PLUS IRON 29-1 MG PO TABS: 1.0000 | ORAL_TABLET | Freq: Every day | ORAL | 11 refills | Status: DC

## 2018-10-31 NOTE — Progress Notes (Signed)
Subjective:        Elizabeth Boyd is a 38 y.o. female here for a routine exam.  Current complaints: None.    Personal health questionnaire:  Is patient [name], have a family history of breast and/or ovarian cancer: no Is there a family history of uterine cancer diagnosed at age < 350, gastrointestinal cancer, urinary tract cancer, family member who is a Personnel officerLynch syndrome-associated carrier: no Is the patient overweight and hypertensive, family history of diabetes, personal history of gestational diabetes, preeclampsia or PCOS: no Is patient over 5955, have PCOS,  family history of premature CHD under age 37, diabetes, smoke, have hypertension or peripheral artery disease:  no At any time, has a partner hit, kicked or otherwise hurt or frightened you?: no Over the past 2 weeks, have you felt down, depressed or hopeless?: no Over the past 2 weeks, have you felt little interest or pleasure in doing things?:no   Gynecologic History Patient's last menstrual period was 10/23/2018. Contraception: abstinence Last Pap: 07-27-2017. Results were: normal Last mammogram: n/a. Results were: n/a  Obstetric History OB History  Gravida Para Term Preterm AB Living  4 4 4     4   SAB TAB Ectopic Multiple Live Births          4    # Outcome Date GA Lbr Len/2nd Weight Sex Delivery Anes PTL Lv  4 Term 03/09/09     Vag-Spont   LIV  3 Term 07/21/07     Vag-Spont   LIV  2 Term 08/21/05     Vag-Spont   LIV  1 Term 12/29/00     Vag-Spont   LIV    History reviewed. No pertinent past medical history.  History reviewed. No pertinent surgical history.   Current Outpatient Medications:  .  ARIPiprazole (ABILIFY) 5 MG tablet, Take 1 tablet (5 mg total) by mouth 2 (two) times daily in the am and at bedtime.. (Patient not taking: Reported on 08/23/2016), Disp: 60 tablet, Rfl: 0 .  benztropine (COGENTIN) 0.5 MG tablet, Take 1 tablet (0.5 mg total) by mouth 2 (two) times daily in the am and at bedtime..  (Patient not taking: Reported on 08/23/2016), Disp: 60 tablet, Rfl: 0 .  ciprofloxacin-dexamethasone (CIPRODEX) otic suspension, Place 4 drops into the left ear 2 (two) times daily. (Patient not taking: Reported on 08/23/2016), Disp: 7.5 mL, Rfl: 0 .  cyclobenzaprine (FLEXERIL) 5 MG tablet, Take 1 tablet (5 mg total) by mouth 3 (three) times daily as needed for muscle spasms. (Patient not taking: Reported on 07/27/2017), Disp: 30 tablet, Rfl: 1 .  hydrOXYzine (ATARAX/VISTARIL) 25 MG tablet, Take 1 tablet (25 mg total) by mouth every 6 (six) hours as needed for anxiety. (Patient not taking: Reported on 08/23/2016), Disp: 30 tablet, Rfl: 0 .  ibuprofen (ADVIL) 800 MG tablet, Take 1 tablet (800 mg total) by mouth every 8 (eight) hours as needed., Disp: 30 tablet, Rfl: 5 .  lamoTRIgine (LAMICTAL) 25 MG tablet, Take 1 tablet (25 mg total) by mouth 2 (two) times daily. (Patient not taking: Reported on 08/23/2016), Disp: 60 tablet, Rfl: 0 .  lisinopril (PRINIVIL,ZESTRIL) 10 MG tablet, Take 1 tablet (10 mg total) by mouth daily. (Patient not taking: Reported on 08/23/2016), Disp: 30 tablet, Rfl: 0 .  loratadine (CLARITIN) 10 MG tablet, Take 1 tablet (10 mg total) by mouth daily., Disp: 30 tablet, Rfl: 11 .  Prenatal Vit-Fe Fumarate-FA (PNV PRENATAL PLUS MULTIVITAMIN) 27-1 MG TABS, Take 1 tablet by mouth  daily before breakfast. (Patient not taking: Reported on 07/27/2017), Disp: 30 tablet, Rfl: 11 .  Prenatal Vit-Iron Carbonyl-FA (PRENATAL PLUS IRON) 29-1 MG TABS, Take 1 tablet by mouth daily before breakfast., Disp: 30 tablet, Rfl: 11 No Known Allergies  Social History   Tobacco Use  . Smoking status: Never Smoker  . Smokeless tobacco: Never Used  Substance Use Topics  . Alcohol use: No    Alcohol/week: 0.0 standard drinks    Family History  Problem Relation Age of Onset  . Diabetes Paternal Grandmother       Review of Systems  Constitutional: negative for fatigue and weight loss Respiratory: negative for  cough and wheezing Cardiovascular: negative for chest pain, fatigue and palpitations Gastrointestinal: negative for abdominal pain and change in bowel habits Musculoskeletal:negative for myalgias Neurological: negative for gait problems and tremors Behavioral/Psych: negative for abusive relationship, depression Endocrine: negative for temperature intolerance    Genitourinary:negative for abnormal menstrual periods, genital lesions, hot flashes, sexual problems and vaginal discharge Integument/breast: negative for breast lump, breast tenderness, nipple discharge and skin lesion(s)    Objective:       BP 108/72   Pulse 64   Ht 5\' 2"  (1.575 m)   Wt 155 lb (70.3 kg)   LMP 10/23/2018   BMI 28.35 kg/m  General:   alert  Skin:   no rash or abnormalities  Lungs:   clear to auscultation bilaterally  Heart:   regular rate and rhythm, S1, S2 normal, no murmur, click, rub or gallop  Breasts:   normal without suspicious masses, skin or nipple changes or axillary nodes  Abdomen:  normal findings: no organomegaly, soft, non-tender and no hernia  Pelvis:  External genitalia: normal general appearance Urinary system: urethral meatus normal and bladder without fullness, nontender Vaginal: normal without tenderness, induration or masses Cervix: normal appearance Adnexa: normal bimanual exam Uterus: anteverted and non-tender, normal size   Lab Review Urine pregnancy test Labs reviewed yes Radiologic studies reviewed no  50% of 25 min visit spent on counseling and coordination of care.   Assessment:     1. Encounter for routine gynecological examination with Papanicolaou smear of cervix Rx: - Cytology - PAP( [place])  2. Vaginal discharge Rx: - Cervicovaginal ancillary only( [place])  3. Screen for STD (sexually transmitted disease) Rx: - HIV Antibody (routine testing w rflx) - Hepatitis B surface antigen - RPR - Hepatitis C antibody  4. Routine adult health  maintenance Rx: - Comprehensive metabolic panel - TSH - CBC   Plan:    Education reviewed: calcium supplements, depression evaluation, low fat, low cholesterol diet, safe sex/STD prevention, self breast exams and weight bearing exercise. Contraception: abstinence. Follow up in: 1 year.   Meds ordered this encounter  Medications  . Prenatal Vit-Iron Carbonyl-FA (PRENATAL PLUS IRON) 29-1 MG TABS    Sig: Take 1 tablet by mouth daily before breakfast.    Dispense:  30 tablet    Refill:  11  . ibuprofen (ADVIL) 800 MG tablet    Sig: Take 1 tablet (800 mg total) by mouth every 8 (eight) hours as needed.    Dispense:  30 tablet    Refill:  5   Orders Placed This Encounter  Procedures  . HIV Antibody (routine testing w rflx)  . Hepatitis B surface antigen  . RPR  . Hepatitis C antibody  . Comprehensive metabolic panel  . TSH  . CBC    Shelly Bombard MD 10-31-2018

## 2018-11-01 LAB — COMPREHENSIVE METABOLIC PANEL
ALT: 19 IU/L (ref 0–32)
AST: 21 IU/L (ref 0–40)
Albumin/Globulin Ratio: 1.8 (ref 1.2–2.2)
Albumin: 4.5 g/dL (ref 3.8–4.8)
Alkaline Phosphatase: 80 IU/L (ref 39–117)
BUN/Creatinine Ratio: 15 (ref 9–23)
BUN: 13 mg/dL (ref 6–20)
Bilirubin Total: <0.2 mg/dL (ref 0.0–1.2)
CO2: 23 mmol/L (ref 20–29)
Calcium: 9.1 mg/dL (ref 8.7–10.2)
Chloride: 101 mmol/L (ref 96–106)
Creatinine, Ser: 0.86 mg/dL (ref 0.57–1.00)
GFR calc Af Amer: 101 mL/min/{1.73_m2} (ref >59)
GFR calc non Af Amer: 87 mL/min/{1.73_m2} (ref >59)
Globulin, Total: 2.5 g/dL (ref 1.5–4.5)
Glucose: 88 mg/dL (ref 65–99)
Potassium: 3.6 mmol/L (ref 3.5–5.2)
Sodium: 136 mmol/L (ref 134–144)
Total Protein: 7 g/dL (ref 6.0–8.5)

## 2018-11-01 LAB — CBC
Hematocrit: 37.2 % (ref 34.0–46.6)
Hemoglobin: 12.4 g/dL (ref 11.1–15.9)
MCH: 29 pg (ref 26.6–33.0)
MCHC: 33.3 g/dL (ref 31.5–35.7)
MCV: 87 fL (ref 79–97)
Platelets: 210 10*3/uL (ref 150–450)
RBC: 4.28 x10E6/uL (ref 3.77–5.28)
RDW: 13.3 % (ref 11.7–15.4)
WBC: 6 10*3/uL (ref 3.4–10.8)

## 2018-11-01 LAB — RPR: RPR Ser Ql: NONREACTIVE

## 2018-11-01 LAB — TSH: TSH: 2.37 u[IU]/mL (ref 0.450–4.500)

## 2018-11-01 LAB — HEPATITIS C ANTIBODY: Hep C Virus Ab: <0.1 s/co ratio (ref 0.0–0.9)

## 2018-11-01 LAB — HEPATITIS B SURFACE ANTIGEN: Hepatitis B Surface Ag: NEGATIVE

## 2018-11-01 LAB — HIV ANTIBODY (ROUTINE TESTING W REFLEX): HIV Screen 4th Generation wRfx: NONREACTIVE

## 2018-11-02 LAB — CERVICOVAGINAL ANCILLARY ONLY
Bacterial vaginitis: NEGATIVE
Candida vaginitis: NEGATIVE
Chlamydia: NEGATIVE
Neisseria Gonorrhea: NEGATIVE
Trichomonas: NEGATIVE

## 2018-11-06 LAB — CYTOLOGY - PAP
Diagnosis: NEGATIVE
HPV 16/18/45 genotyping: NEGATIVE
HPV: DETECTED — AB

## 2019-11-29 ENCOUNTER — Ambulatory Visit: Payer: Medicaid Other | Admitting: Podiatry

## 2020-04-02 ENCOUNTER — Other Ambulatory Visit (HOSPITAL_COMMUNITY)
Admission: RE | Admit: 2020-04-02 | Discharge: 2020-04-02 | Disposition: A | Discharge status: Home / Self Care | Payer: Medicaid Other | Source: Ambulatory Visit | Attending: Obstetrics | Admitting: Obstetrics

## 2020-04-02 ENCOUNTER — Other Ambulatory Visit: Payer: Self-pay

## 2020-04-02 ENCOUNTER — Encounter: Payer: Self-pay | Admitting: Obstetrics

## 2020-04-02 ENCOUNTER — Ambulatory Visit (INDEPENDENT_AMBULATORY_CARE_PROVIDER_SITE_OTHER): Payer: Medicaid Other | Admitting: Obstetrics

## 2020-04-02 VITALS — BP 97/63 | HR 79 | Ht 62.0 in | Wt 141.0 lb

## 2020-04-02 DIAGNOSIS — Z01419 Encounter for gynecological examination (general) (routine) without abnormal findings: Secondary | ICD-10-CM

## 2020-04-02 DIAGNOSIS — Z113 Encounter for screening for infections with a predominantly sexual mode of transmission: Secondary | ICD-10-CM

## 2020-04-02 DIAGNOSIS — N898 Other specified noninflammatory disorders of vagina: Secondary | ICD-10-CM

## 2020-04-02 DIAGNOSIS — N926 Irregular menstruation, unspecified: Secondary | ICD-10-CM | POA: Diagnosis not present

## 2020-04-02 DIAGNOSIS — Z0000 Encounter for general adult medical examination without abnormal findings: Secondary | ICD-10-CM | POA: Diagnosis not present

## 2020-04-02 LAB — POCT URINE PREGNANCY: Preg Test, Ur: POSITIVE — AB

## 2020-04-02 MED ORDER — PNV PRENATAL PLUS MULTIVITAMIN 27-1 MG PO TABS: 1.0000 | ORAL_TABLET | Freq: Every day | ORAL | 11 refills | Status: DC

## 2020-04-02 NOTE — Progress Notes (Signed)
Subjective:        Elizabeth Boyd is a 39 y.o. female here for a routine exam.  Current complaints: Irregular periods, vaginal discharge and spotting.    Personal health questionnaire:  Is patient [name], have a family history of breast and/or ovarian cancer: no Is there a family history of uterine cancer diagnosed at age < 49, gastrointestinal cancer, urinary tract cancer, family member who is a Personnel officer syndrome-associated carrier: no Is the patient overweight and hypertensive, family history of diabetes, personal history of gestational diabetes, preeclampsia or PCOS: no Is patient over 44, have PCOS,  family history of premature CHD under age 37, diabetes, smoke, have hypertension or peripheral artery disease:  no At any time, has a partner hit, kicked or otherwise hurt or frightened you?: no Over the past 2 weeks, have you felt down, depressed or hopeless?: no Over the past 2 weeks, have you felt little interest or pleasure in doing things?:no   Gynecologic History Patient's last menstrual period was 02/17/2020. Contraception: none Last Pap: 10-31-2018. Results were: NILM with positive HRHPV Last mammogram: n/a. Results were: n/a  Obstetric History OB History  Gravida Para Term Preterm AB Living  4 4 4     4   SAB IAB Ectopic Multiple Live Births          4    # Outcome Date GA Lbr Len/2nd Weight Sex Delivery Anes PTL Lv  4 Term 03/09/09     Vag-Spont   LIV  3 Term 07/21/07     Vag-Spont   LIV  2 Term 08/21/05     Vag-Spont   LIV  1 Term 12/29/00     Vag-Spont   LIV    History reviewed. No pertinent past medical history.  History reviewed. No pertinent surgical history.   Current Outpatient Medications:  .  ARIPiprazole (ABILIFY) 5 MG tablet, Take 1 tablet (5 mg total) by mouth 2 (two) times daily in the am and at bedtime.. (Patient not taking: No sig reported), Disp: 60 tablet, Rfl: 0 .  ibuprofen (ADVIL) 800 MG tablet, Take 1 tablet (800 mg total) by mouth every  8 (eight) hours as needed. (Patient not taking: Reported on 04/02/2020), Disp: 30 tablet, Rfl: 5 .  loratadine (CLARITIN) 10 MG tablet, Take 1 tablet (10 mg total) by mouth daily., Disp: 30 tablet, Rfl: 11 .  Prenatal Vit-Fe Fumarate-FA (PNV PRENATAL PLUS MULTIVITAMIN) 27-1 MG TABS, Take 1 tablet by mouth daily before breakfast. (Patient not taking: No sig reported), Disp: 30 tablet, Rfl: 11 .  Prenatal Vit-Iron Carbonyl-FA (PRENATAL PLUS IRON) 29-1 MG TABS, Take 1 tablet by mouth daily before breakfast. (Patient not taking: Reported on 04/02/2020), Disp: 30 tablet, Rfl: 11 No Known Allergies  Social History   Tobacco Use  . Smoking status: Never Smoker  . Smokeless tobacco: Never Used  Substance Use Topics  . Alcohol use: No    Alcohol/week: 0.0 standard drinks    Family History  Problem Relation Age of Onset  . Diabetes Paternal Grandmother       Review of Systems  Constitutional: negative for fatigue and weight loss Respiratory: negative for cough and wheezing Cardiovascular: negative for chest pain, fatigue and palpitations Gastrointestinal: negative for abdominal pain and change in bowel habits Musculoskeletal:negative for myalgias Neurological: negative for gait problems and tremors Behavioral/Psych: negative for abusive relationship, depression Endocrine: negative for temperature intolerance    Genitourinary: positive for missed menstrual period, spotting and vaginal discharge.  Negative for  genital lesions, hot flashes, sexual problems                                                                Integument/breast: negative for breast lump, breast tenderness, nipple discharge and skin lesion(s)    Objective:       BP 97/63   Pulse 79   Ht 5\' 2"  (1.575 m)   Wt 141 lb (64 kg)   LMP 02/17/2020   BMI 25.79 kg/m  General:   alert and no distress  Skin:   no rash or abnormalities  Lungs:   clear to auscultation bilaterally  Heart:   regular rate and rhythm, S1, S2  normal, no murmur, click, rub or gallop  Breasts:   normal without suspicious masses, skin or nipple changes or axillary nodes  Abdomen:  normal findings: no organomegaly, soft, non-tender and no hernia  Pelvis:  External genitalia: normal general appearance Urinary system: urethral meatus normal and bladder without fullness, nontender Vaginal: normal without tenderness, induration or masses Cervix: normal appearance Adnexa: normal bimanual exam Uterus: anteverted and non-tender, normal size   Lab Review Urine pregnancy test: Positive Labs reviewed yes Radiologic studies reviewed no  50% of 20 min visit spent on counseling and coordination of care.   Assessment:    .1. Encounter for gynecological examination with Papanicolaou smear of cervix Rx: - Cytology - PAP( [place]) - CBC - Comprehensive metabolic panel  2. Vaginal discharge Rx: - Cervicovaginal ancillary only( [place])  3. Screening for STD (sexually transmitted disease) Rx: - RPR+HBsAg+HCVAb+...  4. Missed period Rx: - POCT urine pregnancy:  Positive  5. Health care maintenance Rx: - Prenatal Vit-Fe Fumarate-FA (PNV PRENATAL PLUS MULTIVITAMIN) 27-1 MG TABS; Take 1 tablet by mouth daily before breakfast.  Dispense: 30 tablet; Refill: 11     Plan:    Education reviewed: calcium supplements, depression evaluation, low fat, low cholesterol diet, safe sex/STD prevention, self breast exams and weight bearing exercise. Follow up in: several weeks.   No orders of the defined types were placed in this encounter.  Orders Placed This Encounter  Procedures  . RPR+HBsAg+HCVAb+...  . POCT urine pregnancy    02/19/2020, MD 04/02/2020 11:41 AM

## 2020-04-03 LAB — CBC
Hematocrit: 36.1 % (ref 34.0–46.6)
Hemoglobin: 11.9 g/dL (ref 11.1–15.9)
MCH: 28.2 pg (ref 26.6–33.0)
MCHC: 33 g/dL (ref 31.5–35.7)
MCV: 86 fL (ref 79–97)
Platelets: 381 10*3/uL (ref 150–450)
RBC: 4.22 x10E6/uL (ref 3.77–5.28)
RDW: 12.9 % (ref 11.7–15.4)
WBC: 4.5 10*3/uL (ref 3.4–10.8)

## 2020-04-03 LAB — COMPREHENSIVE METABOLIC PANEL
ALT: 21 IU/L (ref 0–32)
AST: 13 IU/L (ref 0–40)
Albumin/Globulin Ratio: 1.5 (ref 1.2–2.2)
Albumin: 4 g/dL (ref 3.8–4.8)
Alkaline Phosphatase: 51 IU/L (ref 44–121)
BUN/Creatinine Ratio: 11 (ref 9–23)
BUN: 8 mg/dL (ref 6–20)
Bilirubin Total: 0.3 mg/dL (ref 0.0–1.2)
CO2: 22 mmol/L (ref 20–29)
Calcium: 9.4 mg/dL (ref 8.7–10.2)
Chloride: 108 mmol/L — ABNORMAL HIGH (ref 96–106)
Creatinine, Ser: 0.73 mg/dL (ref 0.57–1.00)
GFR calc Af Amer: 121 mL/min/{1.73_m2} (ref >59)
GFR calc non Af Amer: 105 mL/min/{1.73_m2} (ref >59)
Globulin, Total: 2.6 g/dL (ref 1.5–4.5)
Glucose: 86 mg/dL (ref 65–99)
Potassium: 4 mmol/L (ref 3.5–5.2)
Sodium: 142 mmol/L (ref 134–144)
Total Protein: 6.6 g/dL (ref 6.0–8.5)

## 2020-04-03 LAB — RPR+HBSAG+HCVAB+...
HIV Screen 4th Generation wRfx: NONREACTIVE
Hep C Virus Ab: <0.1 s/co ratio (ref 0.0–0.9)
Hepatitis B Surface Ag: NEGATIVE
RPR Ser Ql: NONREACTIVE

## 2020-04-03 LAB — CERVICOVAGINAL ANCILLARY ONLY
Bacterial Vaginitis (gardnerella): POSITIVE — AB
Candida Glabrata: NEGATIVE
Candida Vaginitis: NEGATIVE
Chlamydia: POSITIVE — AB
Comment: NEGATIVE
Comment: NEGATIVE
Comment: NEGATIVE
Comment: NEGATIVE
Comment: NEGATIVE
Comment: NORMAL
Neisseria Gonorrhea: NEGATIVE
Trichomonas: NEGATIVE

## 2020-04-06 ENCOUNTER — Other Ambulatory Visit: Payer: Self-pay | Admitting: Obstetrics

## 2020-04-06 DIAGNOSIS — A749 Chlamydial infection, unspecified: Secondary | ICD-10-CM

## 2020-04-06 DIAGNOSIS — N760 Acute vaginitis: Secondary | ICD-10-CM

## 2020-04-06 LAB — CYTOLOGY - PAP
Comment: NEGATIVE
Diagnosis: UNDETERMINED — AB
High risk HPV: NEGATIVE

## 2020-04-06 MED ORDER — METRONIDAZOLE 500 MG PO TABS: 500.0000 mg | ORAL_TABLET | Freq: Two times a day (BID) | ORAL | 2 refills | Status: DC

## 2020-04-06 MED ORDER — AZITHROMYCIN 500 MG PO TABS: 1000.0000 mg | ORAL_TABLET | Freq: Once | ORAL | 0 refills | Status: AC

## 2020-04-10 ENCOUNTER — Telehealth: Payer: Self-pay

## 2020-04-10 NOTE — Telephone Encounter (Signed)
Advised of all results, rx and treatment plans

## 2020-05-19 ENCOUNTER — Ambulatory Visit (INDEPENDENT_AMBULATORY_CARE_PROVIDER_SITE_OTHER): Payer: Medicaid Other | Admitting: Obstetrics

## 2020-05-19 ENCOUNTER — Encounter: Payer: Self-pay | Admitting: Obstetrics

## 2020-05-19 ENCOUNTER — Other Ambulatory Visit: Payer: Self-pay

## 2020-05-19 VITALS — BP 124/75 | HR 61 | Wt 140.9 lb

## 2020-05-19 DIAGNOSIS — N926 Irregular menstruation, unspecified: Secondary | ICD-10-CM

## 2020-05-19 DIAGNOSIS — N939 Abnormal uterine and vaginal bleeding, unspecified: Secondary | ICD-10-CM

## 2020-05-19 LAB — POCT URINE PREGNANCY: Preg Test, Ur: NEGATIVE

## 2020-05-19 NOTE — Progress Notes (Signed)
Patient is here to discuss positive pregnancy results.

## 2020-05-19 NOTE — Progress Notes (Signed)
Patient ID: Elizabeth Boyd, female   DOB: 05/22/1980, 40 y.o.   MRN: [id number]  Chief Complaint  Patient presents with  . Follow-up    SPOTTING     HPI Elizabeth Boyd is a 40 y.o. female.  Presents for follow up of positive UPT in December 2021.  She did have a period after Annual / Pap on 04-02-2020, but it was not normal.  Her periods are usually q 21 days. HPI  History reviewed. No pertinent past medical history.  History reviewed. No pertinent surgical history.  Family History  Problem Relation Age of Onset  . Diabetes Paternal Grandmother     Social History Social History   Tobacco Use  . Smoking status: Never Smoker  . Smokeless tobacco: Never Used  Substance Use Topics  . Alcohol use: No    Alcohol/week: 0.0 standard drinks  . Drug use: Not Currently    Types: Marijuana    No Known Allergies  Current Outpatient Medications  Medication Sig Dispense Refill  . ARIPiprazole (ABILIFY) 5 MG tablet Take 1 tablet (5 mg total) by mouth 2 (two) times daily in the am and at bedtime.. (Patient not taking: No sig reported) 60 tablet 0  . loratadine (CLARITIN) 10 MG tablet Take 1 tablet (10 mg total) by mouth daily. (Patient not taking: Reported on 05/19/2020) 30 tablet 11  . metroNIDAZOLE (FLAGYL) 500 MG tablet Take 1 tablet (500 mg total) by mouth 2 (two) times daily. (Patient not taking: Reported on 05/19/2020) 14 tablet 2  . Prenatal Vit-Fe Fumarate-FA (PNV PRENATAL PLUS MULTIVITAMIN) 27-1 MG TABS Take 1 tablet by mouth daily before breakfast. (Patient not taking: Reported on 05/19/2020) 30 tablet 11  . Prenatal Vit-Iron Carbonyl-FA (PRENATAL PLUS IRON) 29-1 MG TABS Take 1 tablet by mouth daily before breakfast. (Patient not taking: Reported on 05/19/2020) 30 tablet 11   No current facility-administered medications for this visit.    Review of Systems Review of Systems Constitutional: negative for fatigue and weight loss Respiratory: negative for cough and  wheezing Cardiovascular: negative for chest pain, fatigue and palpitations Gastrointestinal: negative for abdominal pain and change in bowel habits Genitourinary: positive for missed periods Integument/breast: negative for nipple discharge Musculoskeletal:negative for myalgias Neurological: negative for gait problems and tremors Behavioral/Psych: negative for abusive relationship, depression Endocrine: negative for temperature intolerance      Blood pressure 124/75, pulse 61, weight 140 lb 14.4 oz (63.9 kg).  Physical Exam Physical Exam General:   alert  Skin:   no rash or abnormalities  Lungs:   clear to auscultation bilaterally  Heart:   regular rate and rhythm, S1, S2 normal, no murmur, click, rub or gallop  Breasts:   normal without suspicious masses, skin or nipple changes or axillary nodes  Abdomen:  normal findings: no organomegaly, soft, non-tender and no hernia   50% of 15 min visit spent on counseling and coordination of care.   Data Reviewed Results for NYASHA, RAHILLY (MRN [id number]) as of 05/19/2020 14:51  Ref. Range 05/19/2020 13:28  Preg Test, Ur Latest Ref Range: Negative  Negative    Assessment     1. Missed period Rx: - POCT urine pregnancy: Negative - will follow clinically.  If no period in February and if UPT is negative, will Rx Provera for a withdrawal bleed  2. Abnormal uterine bleeding (AUB) - vaginal spotting this month    Plan   Follow up in a few weeks prn  Orders Placed This Encounter  Procedures  .  POCT urine pregnancy    Assciate with Z32.02 (negative pregnancy test). If positive, switch to Z32.01 (positive pregnancy test)      Brock Bad, MD 05/19/2020 2:56 PM

## 2021-01-14 ENCOUNTER — Other Ambulatory Visit: Payer: Self-pay

## 2021-01-14 ENCOUNTER — Encounter: Payer: Self-pay | Admitting: *Deleted

## 2021-01-14 ENCOUNTER — Other Ambulatory Visit (HOSPITAL_COMMUNITY)
Admission: RE | Admit: 2021-01-14 | Discharge: 2021-01-14 | Disposition: A | Discharge status: Home / Self Care | Payer: Medicaid Other | Source: Ambulatory Visit | Attending: Obstetrics | Admitting: Obstetrics

## 2021-01-14 ENCOUNTER — Encounter: Payer: Self-pay | Admitting: Obstetrics

## 2021-01-14 ENCOUNTER — Ambulatory Visit (INDEPENDENT_AMBULATORY_CARE_PROVIDER_SITE_OTHER): Payer: Medicaid Other | Admitting: Obstetrics

## 2021-01-14 VITALS — BP 106/59 | HR 81 | Ht 62.0 in | Wt 145.5 lb

## 2021-01-14 DIAGNOSIS — O09521 Supervision of elderly multigravida, first trimester: Secondary | ICD-10-CM

## 2021-01-14 DIAGNOSIS — Z315 Encounter for genetic counseling: Secondary | ICD-10-CM | POA: Diagnosis not present

## 2021-01-14 DIAGNOSIS — Z3481 Encounter for supervision of other normal pregnancy, first trimester: Secondary | ICD-10-CM | POA: Diagnosis not present

## 2021-01-14 DIAGNOSIS — O09529 Supervision of elderly multigravida, unspecified trimester: Secondary | ICD-10-CM

## 2021-01-14 DIAGNOSIS — O0991 Supervision of high risk pregnancy, unspecified, first trimester: Secondary | ICD-10-CM | POA: Diagnosis not present

## 2021-01-14 DIAGNOSIS — Z3A11 11 weeks gestation of pregnancy: Secondary | ICD-10-CM | POA: Diagnosis not present

## 2021-01-14 DIAGNOSIS — J301 Allergic rhinitis due to pollen: Secondary | ICD-10-CM

## 2021-01-14 DIAGNOSIS — O0990 Supervision of high risk pregnancy, unspecified, unspecified trimester: Secondary | ICD-10-CM

## 2021-01-14 LAB — POCT URINE PREGNANCY: Preg Test, Ur: POSITIVE — AB

## 2021-01-14 MED ORDER — LORATADINE 10 MG PO TABS: 10.0000 mg | ORAL_TABLET | Freq: Every day | ORAL | 11 refills | Status: DC

## 2021-01-14 MED ORDER — BLOOD PRESSURE KIT DEVI: 1.0000 | 0 refills | Status: DC

## 2021-01-14 MED ORDER — PRENATAL PLUS IRON 29-1 MG PO TABS: 1.0000 | ORAL_TABLET | Freq: Every day | ORAL | 4 refills | Status: DC

## 2021-01-14 NOTE — Progress Notes (Signed)
[facility] has a partnership with the [employer] [facility] to provide prenatal navigation for the most needed resources in our community. In order to see how we can help connect you to these resources we need consent to contact you. Please complete the very short consent using the link below:   English Link: [email or web address]  Spanish Link: [email or web address]  

## 2021-01-14 NOTE — Progress Notes (Signed)
New OB Intake  I connected with  Elizabeth Boyd on 01/14/21 at  8:55 AM EDT by in person Visit and verified that I am speaking with the correct person using two identifiers. Nurse is located at [facility] and pt is located at [place].  I discussed the limitations, risks, security and privacy concerns of performing an evaluation and management service by telephone and the availability of in person appointments. I also discussed with the patient that there may be a patient responsible charge related to this service. The patient expressed understanding and agreed to proceed.  I explained I am completing New OB Intake today. We discussed her EDD of 07/30/21 that is based on LMP of 10/23/20. Pt is G5/P4. I reviewed her allergies, medications, Medical/Surgical/OB history, and appropriate screenings. I informed her of [facility] services. Based on history, this is a/an  pregnancy uncomplicated .   Patient Active Problem List   Diagnosis Date Noted   Hyperprolactinemia (HCC) 06/26/2015   Schizoaffective disorder, bipolar type (HCC) 06/25/2015   Cannabis use disorder, severe, dependence (HCC) 06/24/2015   Vitiligo 06/24/2015   Pelvic pain in female 01/12/2015    Concerns addressed today  Delivery Plans:  Plans to deliver at [facility].   MyChart/Babyscripts MyChart access verified. I explained pt will have some visits in office and some virtually. Babyscripts instructions given and order placed. Patient verifies receipt of registration text/e-mail. Account successfully created and app downloaded.  Blood Pressure Cuff  Blood pressure cuff ordered for patient to pick-up from [employer]. Explained after first prenatal appt pt will check weekly and document in Babyscripts.  Weight scale: Patient    have weight scale. Weight scale ordered   Anatomy US Explained first scheduled [place] will be around 19 weeks. Anatomy US scheduled for 19wks at [facility]. Pt notified to arrive at [facility].  Labs Discussed Avelina Laine genetic  screening with patient. Would like both Panorama and Horizon drawn at new OB visit. Routine prenatal labs needed.  Covid Vaccine Patient has not covid vaccine.   Social Determinants of Health Food Insecurity: Patient denies food insecurity. WIC Referral: Patient is not interested in referral to [facility].  Transportation: Patient denies transportation needs. Childcare: Discussed no children allowed at ultrasound appointments. Offered childcare services; patient declines childcare services at this time.  First visit review I reviewed new OB appt with pt. I explained she will have a pelvic exam, ob bloodwork with genetic screening, and PAP smear. Explained pt will be seen by Dr Clearance Coots at first visit; encounter routed to appropriate provider. Explained that patient will be seen by pregnancy navigator following visit with provider. [facility] information placed in AVS.   Harrel Lemon, RN 01/14/2021  9:35 AM

## 2021-01-14 NOTE — Progress Notes (Signed)
Missed period, extreme fatigue, weight gain. Positive UPT today. New OB OB Panel, OB Urine, GC/CC- Pap was 03/2020 Genetic screening offered and accepted. Depression and anxiety screen negative today. See OB Intake Note

## 2021-01-14 NOTE — Progress Notes (Signed)
Subjective:    Elizabeth Boyd is being seen today for her first obstetrical visit.  This is not a planned pregnancy. She is at 16w6dgestation. Her obstetrical history is significant for advanced maternal age. Relationship with FOB: spouse, living together. Patient does intend to breast feed. Pregnancy history fully reviewed.  The information documented in the HPI was reviewed and verified.  Menstrual History: OB History     Gravida  5   Para  4   Term  4   Preterm      AB      Living  4      SAB      IAB      Ectopic      Multiple      Live Births  4           Patient's last menstrual period was 10/23/2020. Period Cycle (Days): -2 Period Pattern: Regular  History reviewed. No pertinent past medical history.  History reviewed. No pertinent surgical history.  (Not in a hospital admission)  No Known Allergies  Social History   Tobacco Use   Smoking status: Never   Smokeless tobacco: Never  Substance Use Topics   Alcohol use: No    Alcohol/week: 0.0 standard drinks    Family History  Problem Relation Age of Onset   Diabetes Paternal Grandmother      Review of Systems Constitutional: negative for weight loss Gastrointestinal: negative for vomiting Genitourinary:negative for genital lesions and vaginal discharge and dysuria Musculoskeletal:negative for back pain Behavioral/Psych: negative for abusive relationship, depression, illegal drug usage and tobacco use    Objective:    BP (!) 106/59   Pulse 81   Ht _0  (1.575 m)   Wt 145 lb 8 oz (66 kg)   LMP 10/23/2020   BMI 26.61 kg/m  General Appearance:    Alert, cooperative, no distress, appears stated age  Head:    Normocephalic, without obvious abnormality, atraumatic  Eyes:    PERRL, conjunctiva/corneas clear, EOM's intact, fundi    benign, both eyes  Ears:    Normal TM's and external ear canals, both ears  Nose:   Nares normal, septum midline, mucosa normal, no drainage    or sinus  tenderness  Throat:   Lips, mucosa, and tongue normal; teeth and gums normal  Neck:   Supple, symmetrical, trachea midline, no adenopathy;    thyroid:  no enlargement/tenderness/nodules; no carotid   bruit or JVD  Back:     Symmetric, no curvature, ROM normal, no CVA tenderness  Lungs:     Clear to auscultation bilaterally, respirations unlabored  Chest Wall:    No tenderness or deformity   Heart:    Regular rate and rhythm, S1 and S2 normal, no murmur, rub   or gallop  Breast Exam:    No tenderness, masses, or nipple abnormality  Abdomen:     Soft, non-tender, bowel sounds active all four quadrants,    no masses, no organomegaly  Genitalia:    Normal female without lesion, discharge or tenderness  Extremities:   Extremities normal, atraumatic, no cyanosis or edema  Pulses:   2+ and symmetric all extremities  Skin:   Skin color, texture, turgor normal, no rashes or lesions  Lymph nodes:   Cervical, supraclavicular, and axillary nodes normal  Neurologic:   CNII-XII intact, normal strength, sensation and reflexes    throughout      Lab Review Urine pregnancy test Labs reviewed yes Radiologic studies reviewed  no  Assessment:    Pregnancy at 96w6dweeks    Plan:   1. Supervision of high risk pregnancy, antepartum Rx: - POCT urine pregnancy - CBC/D/Plt+RPR+Rh+ABO+RubIgG... - Culture, OB Urine - Genetic Screening - Cervicovaginal ancillary only( [place]) - Prenatal Vit-Iron Carbonyl-FA (PRENATAL PLUS IRON) 29-1 MG TABS; Take 1 tablet by mouth daily before breakfast.  Dispense: 90 tablet; Refill: 4 - [place] OB DETAIL +14 WK; Future - Enroll Patient in PreNatal Babyscripts - Babyscripts Schedule Optimization - Blood Pressure Monitoring (BLOOD PRESSURE KIT) DEVI; 1 Device by Does not apply route once a week.  Dispense: 1 each; Refill: 0  2. Antepartum multigravida of advanced maternal age Rx: - AMB MFM GENETICS REFERRAL  3. Seasonal allergic rhinitis due to pollen Rx: -  loratadine (CLARITIN) 10 MG tablet; Take 1 tablet (10 mg total) by mouth daily. (Patient not taking: Reported on 01/14/2021)  Dispense: 30 tablet; Refill: 11    Prenatal vitamins.  Counseling provided regarding continued use of seat belts, cessation of alcohol consumption, smoking or use of illicit drugs; infection precautions i.e., influenza/TDAP immunizations, toxoplasmosis,CMV, parvovirus, listeria and varicella; workplace safety, exercise during pregnancy; routine dental care, safe medications, sexual activity, hot tubs, saunas, pools, travel, caffeine use, fish and methlymercury, potential toxins, hair treatments, varicose veins Weight gain recommendations per IOM guidelines reviewed: underweight/BMI< 18.5--> gain 28 - 40 lbs; normal weight/BMI 18.5 - 24.9--> gain 25 - 35 lbs; overweight/BMI 25 - 29.9--> gain 15 - 25 lbs; obese/BMI >30->gain  11 - 20 lbs Problem list reviewed and updated. FIRST/CF mutation testing/NIPT/QUAD SCREEN/fragile X/[name] population testing/Spinal muscular atrophy discussed: requested. Role of ultrasound in pregnancy discussed; fetal survey: requested. Amniocentesis discussed: not indicated.  Meds ordered this encounter  Medications   Prenatal Vit-Iron Carbonyl-FA (PRENATAL PLUS IRON) 29-1 MG TABS    Sig: Take 1 tablet by mouth daily before breakfast.    Dispense:  90 tablet    Refill:  4   loratadine (CLARITIN) 10 MG tablet    Sig: Take 1 tablet (10 mg total) by mouth daily.    Dispense:  30 tablet    Refill:  11    Orders Placed This Encounter  Procedures   Culture, OB Urine   [place] OB DETAIL +14 WK    Standing Status:   Future    Standing Expiration Date:   01/14/2022    Order Specific Question:   Reason for Exam (SYMPTOM  OR DIAGNOSIS REQUIRED)    Answer:   Anatomy    Order Specific Question:   Preferred Location    Answer:   [facility] Ultrasound   CBC/D/Plt+RPR+Rh+ABO+RubIgG...   Genetic Screening    PANORAMA   POCT urine pregnancy     I have spent a total of 20 minutes of face-to-face time, excluding clinical staff time, reviewing notes and preparing to see patient, ordering tests and/or medications, and counseling the patient.   Follow up in 4 weeks  HShelly Bombard MD 01/14/2021 10:06 AM

## 2021-01-15 LAB — CERVICOVAGINAL ANCILLARY ONLY
Bacterial Vaginitis (gardnerella): POSITIVE — AB
Candida Glabrata: NEGATIVE
Candida Vaginitis: NEGATIVE
Chlamydia: NEGATIVE
Comment: NEGATIVE
Comment: NEGATIVE
Comment: NEGATIVE
Comment: NEGATIVE
Comment: NEGATIVE
Comment: NORMAL
Neisseria Gonorrhea: NEGATIVE
Trichomonas: NEGATIVE

## 2021-01-15 LAB — CBC/D/PLT+RPR+RH+ABO+RUBIGG...
Antibody Screen: NEGATIVE
Basophils Absolute: 0 10*3/uL (ref 0.0–0.2)
Basos: 0 %
EOS (ABSOLUTE): 0.1 10*3/uL (ref 0.0–0.4)
Eos: 1 %
HCV Ab: <0.1 s/co ratio (ref 0.0–0.9)
HIV Screen 4th Generation wRfx: NONREACTIVE
Hematocrit: 34.2 % (ref 34.0–46.6)
Hemoglobin: 11.3 g/dL (ref 11.1–15.9)
Hepatitis B Surface Ag: NEGATIVE
Immature Grans (Abs): 0.1 10*3/uL (ref 0.0–0.1)
Immature Granulocytes: 1 %
Lymphocytes Absolute: 0.7 10*3/uL (ref 0.7–3.1)
Lymphs: 14 %
MCH: 29.3 pg (ref 26.6–33.0)
MCHC: 33 g/dL (ref 31.5–35.7)
MCV: 89 fL (ref 79–97)
Monocytes Absolute: 0.8 10*3/uL (ref 0.1–0.9)
Monocytes: 15 %
Neutrophils Absolute: 3.6 10*3/uL (ref 1.4–7.0)
Neutrophils: 69 %
Platelets: 157 10*3/uL (ref 150–450)
RBC: 3.86 x10E6/uL (ref 3.77–5.28)
RDW: 13.2 % (ref 11.7–15.4)
RPR Ser Ql: NONREACTIVE
Rh Factor: POSITIVE
Rubella Antibodies, IGG: 4.61 index (ref >0.99)
WBC: 5.2 10*3/uL (ref 3.4–10.8)

## 2021-01-15 LAB — HCV INTERPRETATION

## 2021-01-16 LAB — URINE CULTURE, OB REFLEX

## 2021-01-16 LAB — CULTURE, OB URINE

## 2021-01-18 ENCOUNTER — Other Ambulatory Visit: Payer: Self-pay | Admitting: Obstetrics

## 2021-01-18 ENCOUNTER — Encounter: Payer: Self-pay | Admitting: *Deleted

## 2021-01-18 DIAGNOSIS — B9689 Other specified bacterial agents as the cause of diseases classified elsewhere: Secondary | ICD-10-CM

## 2021-01-18 MED ORDER — METRONIDAZOLE 500 MG PO TABS: 500.0000 mg | ORAL_TABLET | Freq: Two times a day (BID) | ORAL | 2 refills | Status: DC

## 2021-01-18 NOTE — Progress Notes (Signed)
TC to inform or BV and RX sent. No answer. VM left stating that message was sent in MyChart and for patient to call the office for further questions. MyChart message regarding BV and RX sent. Patient education on BV included in message.

## 2021-01-25 ENCOUNTER — Encounter: Payer: Self-pay | Admitting: Obstetrics

## 2021-02-08 ENCOUNTER — Encounter: Payer: Self-pay | Admitting: Obstetrics

## 2021-02-11 ENCOUNTER — Encounter: Payer: Medicaid Other | Admitting: Obstetrics and Gynecology

## 2021-02-22 ENCOUNTER — Encounter: Payer: Medicaid Other | Admitting: Obstetrics and Gynecology

## 2021-02-22 NOTE — Progress Notes (Deleted)
Patient did not keep appointment for ROB. Unable to reach patient to convert to a virtual visit

## 2021-03-02 ENCOUNTER — Ambulatory Visit: Payer: Medicaid Other

## 2021-03-02 ENCOUNTER — Other Ambulatory Visit: Payer: Self-pay

## 2021-03-02 ENCOUNTER — Encounter: Payer: Medicaid Other | Admitting: Obstetrics and Gynecology

## 2021-03-02 ENCOUNTER — Ambulatory Visit: Payer: Medicaid Other | Attending: Obstetrics

## 2021-04-06 ENCOUNTER — Encounter: Payer: Self-pay | Admitting: Obstetrics

## 2021-04-06 ENCOUNTER — Other Ambulatory Visit: Payer: Self-pay

## 2021-04-06 ENCOUNTER — Ambulatory Visit (INDEPENDENT_AMBULATORY_CARE_PROVIDER_SITE_OTHER): Payer: Medicaid Other | Admitting: Obstetrics

## 2021-04-06 DIAGNOSIS — O09521 Supervision of elderly multigravida, first trimester: Secondary | ICD-10-CM

## 2021-04-06 NOTE — Progress Notes (Signed)
Subjective:  Elizabeth Boyd is a 40 y.o. G5P4004 at [redacted]w[redacted]d being seen today for ongoing prenatal care.  She is currently monitored for the following issues for this low-risk pregnancy and has Pelvic pain in female; Cannabis use disorder, severe, dependence (HCC); Vitiligo; Schizoaffective disorder, bipolar type (HCC); Hyperprolactinemia (HCC); and Supervision of elderly multigravida in first trimester on their problem list.  Patient reports no complaints.  Contractions: Not present. Vag. Bleeding: None.  Movement: Present. Denies leaking of fluid.   The following portions of the patient's history were reviewed and updated as appropriate: allergies, current medications, past family history, past medical history, past social history, past surgical history and problem list. Problem list updated.  Objective:   Vitals:   04/06/21 1510  BP: 112/65  Pulse: 86  Weight: 171 lb (77.6 kg)    Fetal Status: Fetal Heart Rate (bpm): 142   Movement: Present     General:  Alert, oriented and cooperative. Patient is in no acute distress.  Skin: Skin is warm and dry. No rash noted.   Cardiovascular: Normal heart rate noted  Respiratory: Normal respiratory effort, no problems with respiration noted  Abdomen: Soft, gravid, appropriate for gestational age. Pain/Pressure: Absent     Pelvic:  Cervical exam deferred        Extremities: Normal range of motion.  Edema: None  Mental Status: Normal mood and affect. Normal behavior. Normal judgment and thought content.   Urinalysis:      Assessment and Plan:  Pregnancy: G5P4004 at [redacted]w[redacted]d  1. Supervision of elderly multigravida in first trimester   Preterm labor symptoms and general obstetric precautions including but not limited to vaginal bleeding, contractions, leaking of fluid and fetal movement were reviewed in detail with the patient. Please refer to After Visit Summary for other counseling recommendations.   No follow-ups on file.   Brock Bad,  MD  04/06/21

## 2021-04-12 ENCOUNTER — Other Ambulatory Visit: Payer: Self-pay | Admitting: Obstetrics

## 2021-04-12 DIAGNOSIS — O09522 Supervision of elderly multigravida, second trimester: Secondary | ICD-10-CM

## 2021-04-12 DIAGNOSIS — Z3A24 24 weeks gestation of pregnancy: Secondary | ICD-10-CM

## 2021-04-12 DIAGNOSIS — Z363 Encounter for antenatal screening for malformations: Secondary | ICD-10-CM

## 2021-04-12 NOTE — Progress Notes (Unsigned)
24 wee

## 2021-04-14 ENCOUNTER — Encounter: Payer: Self-pay | Admitting: *Deleted

## 2021-04-14 ENCOUNTER — Ambulatory Visit: Payer: Medicaid Other | Attending: Obstetrics and Gynecology

## 2021-04-14 ENCOUNTER — Ambulatory Visit: Payer: Medicaid Other | Admitting: *Deleted

## 2021-04-14 ENCOUNTER — Ambulatory Visit: Payer: Medicaid Other

## 2021-04-14 ENCOUNTER — Other Ambulatory Visit: Payer: Self-pay | Admitting: *Deleted

## 2021-04-14 ENCOUNTER — Other Ambulatory Visit: Payer: Self-pay

## 2021-04-14 VITALS — BP 104/53 | HR 74

## 2021-04-14 DIAGNOSIS — Z3689 Encounter for other specified antenatal screening: Secondary | ICD-10-CM

## 2021-04-14 DIAGNOSIS — O09523 Supervision of elderly multigravida, third trimester: Secondary | ICD-10-CM

## 2021-04-14 DIAGNOSIS — O09522 Supervision of elderly multigravida, second trimester: Secondary | ICD-10-CM | POA: Insufficient documentation

## 2021-04-14 DIAGNOSIS — Z3A24 24 weeks gestation of pregnancy: Secondary | ICD-10-CM | POA: Diagnosis not present

## 2021-04-14 DIAGNOSIS — Z363 Encounter for antenatal screening for malformations: Secondary | ICD-10-CM | POA: Insufficient documentation

## 2021-04-14 NOTE — Progress Notes (Signed)
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   EDC: 07/04/2021 LMP: 10/23/2020 Referring Office:  Medicine, [facility]  EGA: [redacted]w[redacted]d Genetic Counselor: Teena Dunk, MS, CGC  OB Hx: [id number] Date of Appointment: 04/14/2021  Accompanied by: Her 74 year old daughter Face to Face Time: 30 Minutes   Previous Testing Completed: Elizabeth Boyd previously completed Non-Invasive Prenatal Screening (NIPS) in this pregnancy (scanned into Epic under the Media tab). The result is low risk. This screening significantly reduces the risk that the current pregnancy has Down syndrome, Trisomy 27, Trisomy 13, Monosomy X, and Triploidy, however, the risk is not zero given the limitations of NIPS. Additionally, there are many genetic conditions that cannot be detected by NIPS.  Elizabeth Boyd previously completed carrier screening (scanned into Epic under the Media tab). She screened to have an increased chance to be a silent carrier for Spinal Muscular Atrophy (SMA) - see genetic counseling portion of note below. She screened to not be a carrier for Cystic Fibrosis (CF), alpha thalassemia, and beta hemoglobinopathies. A negative result on carrier screening reduces the likelihood of being a carrier, however, does not entirely rule out the possibility.     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 SMA is an autosomal recessive disease caused by loss of function mutations in the SMN1 gene. Adalynd screened to have two functional copies of the SMN1 gene, however, due to limitations of genetic screening the laboratory cannot confirm if Elizabeth Boyd's two copies are on the same chromosome or on opposite chromosomes. The laboratory identified the variant [id number] in Elizabeth Boyd's screening, meaning there is increased risk for Elizabeth Boyd's two copies of the SMN1 gene to be on the same chromosome. Specifically, given P[place]ani's ethnic background, her  risk to be a silent carrier is approximately 1 in 30. Genetic counseling reviewed with Elizabeth Boyd that if her two copies are on the same chromosome that means her other chromosome would have zero copies, and there could be risk for an affected pregnancy if her reproductive partner is found to be a carrier for SMA. Individuals with SMA have progressive, proximal, and symmetrical muscle weakness and atrophy due to degeneration of motor neurons of the spine and brainstem. Symptoms can first appear at any time between before birth to adulthood. There are five types of SMA that are historically classified based on clinical presentation, onset of symptoms, and the maximum skill level of motor milestones achieved, however, there is a lot of overlap between types. There are several treatments available for individuals affected with SMA and studies show that treatment is most effective when it is started in the first few months of life. Given Elizabeth Boyd's carrier screening result, genetic counseling offered screening for her reproductive partner for SMA. Elizabeth Boyd verbalized understanding of the information discussed and declined carrier screening for her reproductive partner for SMA. We encouraged her to call the [facility] if her partner decides he wants to complete this screening. Genetic counseling reviewed with [place] that SMA is included in [place] 's newborn screening program.   Patient Plan:  Proceed with: Routine prenatal care Informed consent was obtained. All questions were answered.  Declined: Carrier Screening for Dalayna's reproductive partner for SMA   Thank you for sharing in the care of Elizabeth Boyd with [place].  Please do not hesitate to contact us if you have any questions.  Teena Dunk, MS, [facility]

## 2021-04-25 NOTE — L&D Delivery Note (Signed)
OB/GYN Faculty Practice Delivery Note ? ?Elizabeth Boyd is a 41 y.o. [id number] s/p SVD at [redacted]w[redacted]d. She was admitted for spontaneous labor.  ? ?ROM: 1h 20m with clear fluid ?GBS Status:  Negative/-- (02/16 1110) ?Maximum Maternal Temperature: afebrile ? ?Labor Progress: ?Initial SVE: 3/50/ballot. She then progressed to complete.  ? ?Delivery Date/Time:  ?Delivery: Called to room and patient was complete and pushing. Head delivered ROA. No nuchal cord present. Shoulder and body delivered in usual fashion. Infant with spontaneous cry, placed on mother's abdomen, dried and stimulated. Cord clamped x 2 after 1-minute delay, and cut by father of baby. Cord blood drawn. Placenta delivered spontaneously with gentle cord traction. Fundus firm with massage and Pitocin. Labia, perineum, vagina, and cervix inspected inspected with no lacerations.  ?Baby Weight: pending ? ?Placenta: Sent to L&D ?Complications: None ?Lacerations: none ?EBL: 200 mL ?Analgesia: IV pain meds  ? ?Infant:  ?APGAR (1 MIN): 9   ?APGAR (5 MINS): 9   ?APGAR (10 MINS):    ? ?Levie Heritage, DO ?[facility] ?07/05/2021, 8:36 AM ?

## 2021-05-03 DIAGNOSIS — O0990 Supervision of high risk pregnancy, unspecified, unspecified trimester: Secondary | ICD-10-CM | POA: Diagnosis not present

## 2021-05-12 ENCOUNTER — Ambulatory Visit: Payer: Medicaid Other | Attending: Obstetrics and Gynecology

## 2021-05-12 ENCOUNTER — Other Ambulatory Visit: Payer: Self-pay

## 2021-05-12 ENCOUNTER — Encounter: Payer: Self-pay | Admitting: *Deleted

## 2021-05-12 ENCOUNTER — Ambulatory Visit: Payer: Medicaid Other | Admitting: *Deleted

## 2021-05-12 VITALS — BP 111/67 | HR 88

## 2021-05-12 DIAGNOSIS — Z3A32 32 weeks gestation of pregnancy: Secondary | ICD-10-CM | POA: Diagnosis not present

## 2021-05-12 DIAGNOSIS — O09523 Supervision of elderly multigravida, third trimester: Secondary | ICD-10-CM | POA: Diagnosis not present

## 2021-05-13 ENCOUNTER — Ambulatory Visit (INDEPENDENT_AMBULATORY_CARE_PROVIDER_SITE_OTHER): Payer: Medicaid Other | Admitting: Obstetrics

## 2021-05-13 ENCOUNTER — Other Ambulatory Visit: Payer: Self-pay | Admitting: *Deleted

## 2021-05-13 ENCOUNTER — Encounter: Payer: Self-pay | Admitting: Obstetrics

## 2021-05-13 VITALS — BP 120/69 | HR 81 | Wt 171.0 lb

## 2021-05-13 DIAGNOSIS — Z3480 Encounter for supervision of other normal pregnancy, unspecified trimester: Secondary | ICD-10-CM

## 2021-05-13 DIAGNOSIS — Z3A32 32 weeks gestation of pregnancy: Secondary | ICD-10-CM | POA: Diagnosis not present

## 2021-05-13 DIAGNOSIS — O09529 Supervision of elderly multigravida, unspecified trimester: Secondary | ICD-10-CM

## 2021-05-13 DIAGNOSIS — O280 Abnormal hematological finding on antenatal screening of mother: Secondary | ICD-10-CM

## 2021-05-13 DIAGNOSIS — O09523 Supervision of elderly multigravida, third trimester: Secondary | ICD-10-CM

## 2021-05-13 DIAGNOSIS — O0990 Supervision of high risk pregnancy, unspecified, unspecified trimester: Secondary | ICD-10-CM

## 2021-05-13 NOTE — Progress Notes (Signed)
Pt would like to discuss work leave.

## 2021-05-13 NOTE — Progress Notes (Signed)
Subjective:  Elizabeth Boyd is a 41 y.o. G5P4004 at [redacted]w[redacted]d being seen today for ongoing prenatal care.  She is currently monitored for the following issues for this low-risk pregnancy and has Pelvic pain in female; Cannabis use disorder, severe, dependence ([place]); Vitiligo; Schizoaffective disorder, bipolar type ([place]); Hyperprolactinemia ([place]); and Supervision of elderly multigravida in first trimester on their problem list.  Patient reports heartburn.  Contractions: Not present. Vag. Bleeding: None.  Movement: Present. Denies leaking of fluid.   The following portions of the patient's history were reviewed and updated as appropriate: allergies, current medications, past family history, past medical history, past social history, past surgical history and problem list. Problem list updated.  Objective:   Vitals:   05/13/21 1422  BP: 120/69  Pulse: 81  Weight: 171 lb (77.6 kg)    Fetal Status:     Movement: Present     General:  Alert, oriented and cooperative. Patient is in no acute distress.  Skin: Skin is warm and dry. No rash noted.   Cardiovascular: Normal heart rate noted  Respiratory: Normal respiratory effort, no problems with respiration noted  Abdomen: Soft, gravid, appropriate for gestational age. Pain/Pressure: Absent     Pelvic:  Cervical exam deferred        Extremities: Normal range of motion.     Mental Status: Normal mood and affect. Normal behavior. Normal judgment and thought content.   Urinalysis:        [place] MFM OB FOLLOW UP (Accession [id number]) (Order [id number]) Imaging Date: 05/12/2021 Department: [facility] Released By: Laurita Quint Authorizing: Tama High, MD   Exam Status  Status  Final [99]   PACS Intelerad Image Link   Show images for [place] MFM OB FOLLOW UP Study Result  Narrative & Impression  ----------------------------------------------------------------------  OBSTETRICS REPORT                        (Signed Final 05/13/2021 12:47 pm) ---------------------------------------------------------------------- Patient Info  ID #:       [id number]                          D.O.B.:  [birth year] (40 yrs)  Name:       Elizabeth Boyd                    Visit Date: 05/12/2021 03:41 pm ---------------------------------------------------------------------- Performed By  Attending:        Sander Nephew      Secondary Phy.:   [facility]                    MD  Performed By:     Germain Osgood            Address:          [address]  [address] [place]  Referred By:      Clenton Pare              Location[facility]         [facility]                    Nikyah Lackman MD                                [facility]etal [facility] at                                                             [place] [facility]                                                             [facility]  Ref. Address:     [address] [place]                    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 Cord Insertion:      Previously Visualized  Amniotic Fluid  AFI FV:      Within normal limits  AFI Sum(cm)     %Tile       Largest Pocket(cm)  11.92           31          4.21  RUQ(cm)       RLQ(cm)       LUQ(cm)        LLQ(cm)  4.21          0.94          3.37           3.4 ---------------------------------------------------------------------- Biometry  BPD:        83  mm     G. Age:  33w 3d         79  %    CI:        72.84   %    70 - 86                                                          FL/HC:      19.1   %    19.1 - 21.3  HC:      309.2  mm     G. Age:  34w 4d         69  %    HC/AC:      1.15        0.96 - 1.17  AC:      268.6  mm     G. Age:  31w 0d         12  %    FL/BPD:     71.3   %    71 - 87  FL:       59.2  mm     G. Age:  30w 6d          7  %    FL/AC:      22.0   %    20 - 24  HUM:      50.2  mm     G. Age:  29w 3d        < 5  %  LV:        2.7  mm  Est. FW:    1773  gm    3 lb 15 oz      15  % ---------------------------------------------------------------------- OB History  Gravidity:    5         Term:   4  Prem:   0        SAB:   0  TOP:          0       Ectopic:  0        Living: 4 ---------------------------------------------------------------------- Gestational Age  LMP:           28w 5d        Date:  10/23/20                 EDD:   07/30/21  U/S Today:     32w 3d                                        EDD:   07/04/21  Best:          32w 3d     Det. By:  U/S  (04/14/21)          EDD:   07/04/21 ---------------------------------------------------------------------- Anatomy  Cranium:                Appears normal         LVOT:                   Previously seen  Cavum:                 Appears normal         Aortic Arch:            Previously seen  Ventricles:            Appears normal         Ductal Arch:            Previously seen  Choroid Plexus:        Previously seen        Diaphragm:              Appears normal  Cerebellum:            Previously seen        Stomach:                Appears normal, left                                                                        sided  Posterior Fossa:       Previously seen        Abdomen:                Appears normal  Nuchal Fold:           Not applicable ([id number]    Abdominal Wall:         Previously seen                         wks GA)  Face:                  Orbits and profile     Cord Vessels:           Previously seen  previously seen  Lips:                  Appears normal         Kidneys:                Appear normal  Palate:                Not well visualized    Bladder:                Appears normal  Thoracic:              Previously seen        Spine:                  Previously seen  Heart:                 Previously seen        Upper Extremities:      Previously seen  RVOT:                  Previously seen        Lower Extremities:      Previously seen  Other:  Fetus prev appears to be a female. Technically difficult due to          advanced GA and fetal position. Feet prev visualized. Nasal bone          prev visualized. ---------------------------------------------------------------------- Cervix Uterus Adnexa  Cervix  Not visualized (advanced GA >24wks) ---------------------------------------------------------------------- Impression  Follow up growth due to small for gestational age.  Normal interval growth with measurements consistent with  dates  Good fetal movement and amniotic fluid volume ---------------------------------------------------------------------- Recommendations  Follow up  growth in 4-6 weeks. ----------------------------------------------------------------------               Sander Nephew, MD Electronically Signed Final Report   05/13/2021 12:47 pm ----------------------------------------------------------------------       Assessment and Plan:  Pregnancy: [id number] at [redacted]w[redacted]d  1. Supervision of high risk pregnancy, antepartum  2. Antepartum multigravida of advanced maternal age  44. SGA (small for gestational age) infan 1500-1749 gm ( 15th percentile ) - follow up interval growth in 4-6 weeks    There are no diagnoses linked to this encounter. Preterm labor symptoms and general obstetric precautions including but not limited to vaginal bleeding, contractions, leaking of fluid and fetal movement were reviewed in detail with the patient. Please refer to After Visit Summary for other counseling recommendations.   Return in about 2 weeks (around 05/27/2021) for ROB.   Shelly Bombard, MD  05/13/21

## 2021-05-27 ENCOUNTER — Encounter: Payer: Self-pay | Admitting: Obstetrics and Gynecology

## 2021-05-27 ENCOUNTER — Ambulatory Visit (INDEPENDENT_AMBULATORY_CARE_PROVIDER_SITE_OTHER): Payer: Medicaid Other | Admitting: Obstetrics and Gynecology

## 2021-05-27 ENCOUNTER — Other Ambulatory Visit: Payer: Self-pay

## 2021-05-27 VITALS — BP 112/70 | HR 85 | Wt 168.0 lb

## 2021-05-27 DIAGNOSIS — F250 Schizoaffective disorder, bipolar type: Secondary | ICD-10-CM

## 2021-05-27 DIAGNOSIS — O09521 Supervision of elderly multigravida, first trimester: Secondary | ICD-10-CM | POA: Diagnosis not present

## 2021-05-27 NOTE — Progress Notes (Signed)
ROB 34.4 wks Missed visits Needs 28 wk labs  States worried about "preeclampsia", sts on her feet a lot at work.

## 2021-05-27 NOTE — Progress Notes (Signed)
° °  PRENATAL VISIT NOTE  Subjective:  Elizabeth Boyd is a 41 y.o. G5P4004 at [redacted]w[redacted]d being seen today for ongoing prenatal care.  She is currently monitored for the following issues for this high-risk pregnancy and has Pelvic pain in female; Cannabis use disorder, severe, dependence ([place]); Vitiligo; Schizoaffective disorder, bipolar type ([place]); Hyperprolactinemia ([place]); and Supervision of elderly multigravida in first trimester on their problem list.  Patient reports no complaints.  Contractions: Irritability. Vag. Bleeding: None.  Movement: Present. Denies leaking of fluid.   The following portions of the patient's history were reviewed and updated as appropriate: allergies, current medications, past family history, past medical history, past social history, past surgical history and problem list.   Objective:   Vitals:   05/27/21 1546  BP: 112/70  Pulse: 85  Weight: 168 lb (76.2 kg)    Fetal Status: Fetal Heart Rate (bpm): 156 Fundal Height: 34 cm Movement: Present     General:  Alert, oriented and cooperative. Patient is in no acute distress.  Skin: Skin is warm and dry. No rash noted.   Cardiovascular: Normal heart rate noted  Respiratory: Normal respiratory effort, no problems with respiration noted  Abdomen: Soft, gravid, appropriate for gestational age.  Pain/Pressure: Present     Pelvic: Cervical exam deferred        Extremities: Normal range of motion.  Edema: None  Mental Status: Normal mood and affect. Normal behavior. Normal judgment and thought content.   Assessment and Plan:  Pregnancy: [id number] at [redacted]w[redacted]d 1. Supervision of elderly multigravida in first trimester Patient is doing well without complaints Plans to use same pediatrician Undecided on contraception Patient missed glucola- A1C today and third trimester labs Follow up growth ultrasound on 2/15  2. Schizoaffective disorder, bipolar type ([place]) Stable  Preterm labor symptoms and general obstetric precautions  including but not limited to vaginal bleeding, contractions, leaking of fluid and fetal movement were reviewed in detail with the patient. Please refer to After Visit Summary for other counseling recommendations.   Return in about 2 weeks (around 06/10/2021) for in person, ROB, High risk.  Future Appointments  Date Time Provider [place]  06/09/2021  3:00 PM [facility] NURSE [facility]  06/09/2021  3:15 PM [facility] US2 [facility]    Mora Bellman, MD

## 2021-05-28 LAB — CBC
Hematocrit: 30.7 % — ABNORMAL LOW (ref 34.0–46.6)
Hemoglobin: 10.4 g/dL — ABNORMAL LOW (ref 11.1–15.9)
MCH: 26.5 pg — ABNORMAL LOW (ref 26.6–33.0)
MCHC: 33.9 g/dL (ref 31.5–35.7)
MCV: 78 fL — ABNORMAL LOW (ref 79–97)
Platelets: 183 10*3/uL (ref 150–450)
RBC: 3.92 x10E6/uL (ref 3.77–5.28)
RDW: 14.3 % (ref 11.7–15.4)
WBC: 6.8 10*3/uL (ref 3.4–10.8)

## 2021-05-28 LAB — HEMOGLOBIN A1C
Est. average glucose Bld gHb Est-mCnc: 117 mg/dL
Hgb A1c MFr Bld: 5.7 % — ABNORMAL HIGH (ref 4.8–5.6)

## 2021-05-28 LAB — RPR: RPR Ser Ql: NONREACTIVE

## 2021-05-28 LAB — HIV ANTIBODY (ROUTINE TESTING W REFLEX): HIV Screen 4th Generation wRfx: NONREACTIVE

## 2021-05-31 ENCOUNTER — Encounter: Payer: Self-pay | Admitting: Obstetrics

## 2021-06-09 ENCOUNTER — Ambulatory Visit: Payer: Medicaid Other | Attending: Maternal & Fetal Medicine

## 2021-06-09 ENCOUNTER — Ambulatory Visit: Payer: Medicaid Other | Admitting: *Deleted

## 2021-06-09 ENCOUNTER — Other Ambulatory Visit: Payer: Self-pay

## 2021-06-09 VITALS — BP 107/67 | HR 83

## 2021-06-09 DIAGNOSIS — O09523 Supervision of elderly multigravida, third trimester: Secondary | ICD-10-CM | POA: Diagnosis not present

## 2021-06-09 DIAGNOSIS — O280 Abnormal hematological finding on antenatal screening of mother: Secondary | ICD-10-CM | POA: Diagnosis not present

## 2021-06-09 DIAGNOSIS — Z3A36 36 weeks gestation of pregnancy: Secondary | ICD-10-CM | POA: Diagnosis not present

## 2021-06-10 ENCOUNTER — Encounter: Payer: Self-pay | Admitting: Obstetrics and Gynecology

## 2021-06-10 ENCOUNTER — Other Ambulatory Visit (HOSPITAL_COMMUNITY)
Admission: RE | Admit: 2021-06-10 | Discharge: 2021-06-10 | Disposition: A | Discharge status: Home / Self Care | Payer: Medicaid Other | Source: Ambulatory Visit | Attending: Obstetrics and Gynecology | Admitting: Obstetrics and Gynecology

## 2021-06-10 ENCOUNTER — Ambulatory Visit (INDEPENDENT_AMBULATORY_CARE_PROVIDER_SITE_OTHER): Payer: Medicaid Other | Admitting: Obstetrics and Gynecology

## 2021-06-10 VITALS — BP 112/75 | HR 83 | Wt 174.6 lb

## 2021-06-10 DIAGNOSIS — O09521 Supervision of elderly multigravida, first trimester: Secondary | ICD-10-CM | POA: Diagnosis not present

## 2021-06-10 DIAGNOSIS — Z3A36 36 weeks gestation of pregnancy: Secondary | ICD-10-CM

## 2021-06-10 DIAGNOSIS — O09523 Supervision of elderly multigravida, third trimester: Secondary | ICD-10-CM | POA: Diagnosis not present

## 2021-06-10 DIAGNOSIS — F250 Schizoaffective disorder, bipolar type: Secondary | ICD-10-CM

## 2021-06-10 NOTE — Progress Notes (Signed)
° °  PRENATAL VISIT NOTE  Subjective:  Elizabeth Boyd is a 41 y.o. G5P4004 at [redacted]w[redacted]d being seen today for ongoing prenatal care.  She is currently monitored for the following issues for this high-risk pregnancy and has Pelvic pain in female; Cannabis use disorder, severe, dependence ([place]); Vitiligo; Schizoaffective disorder, bipolar type ([place]); Hyperprolactinemia ([place]); and Supervision of elderly multigravida in first trimester on their problem list.  Patient reports no complaints.  Contractions: Irregular. Vag. Bleeding: None.  Movement: Present. Denies leaking of fluid.   The following portions of the patient's history were reviewed and updated as appropriate: allergies, current medications, past family history, past medical history, past social history, past surgical history and problem list.   Objective:   Vitals:   06/10/21 1036  BP: 112/75  Pulse: 83  Weight: 174 lb 9.6 oz (79.2 kg)    Fetal Status: Fetal Heart Rate (bpm): 145 Fundal Height: 37 cm Movement: Present     General:  Alert, oriented and cooperative. Patient is in no acute distress.  Skin: Skin is warm and dry. No rash noted.   Cardiovascular: Normal heart rate noted  Respiratory: Normal respiratory effort, no problems with respiration noted  Abdomen: Soft, gravid, appropriate for gestational age.  Pain/Pressure: Present     Pelvic: Cervical exam performed in the presence of a chaperone Dilation: Closed Effacement (%): Thick Station: Ballotable  Extremities: Normal range of motion.     Mental Status: Normal mood and affect. Normal behavior. Normal judgment and thought content.   Assessment and Plan:  Pregnancy: [id number] at [redacted]w[redacted]d 1. Supervision of elderly multigravida in first trimester Patient is doing well without complaints Cultures collected Discussed A1C in prediabetic range. Patient declined 1 hour glucola or CBG testing Discussed dietary changes until delivery  2. Schizoaffective disorder, bipolar type  ([place]) Stable  Preterm labor symptoms and general obstetric precautions including but not limited to vaginal bleeding, contractions, leaking of fluid and fetal movement were reviewed in detail with the patient. Please refer to After Visit Summary for other counseling recommendations.   Return in about 1 week (around 06/17/2021) for in person, ROB, High risk.  No future appointments.  Mora Bellman, MD

## 2021-06-11 LAB — CERVICOVAGINAL ANCILLARY ONLY
Chlamydia: NEGATIVE
Comment: NEGATIVE
Comment: NORMAL
Neisseria Gonorrhea: NEGATIVE

## 2021-06-14 LAB — CULTURE, BETA STREP (GROUP B ONLY): Strep Gp B Culture: NEGATIVE

## 2021-06-16 ENCOUNTER — Encounter: Payer: Self-pay | Admitting: *Deleted

## 2021-06-17 ENCOUNTER — Encounter: Payer: Medicaid Other | Admitting: Obstetrics and Gynecology

## 2021-07-01 ENCOUNTER — Encounter: Payer: Self-pay | Admitting: *Deleted

## 2021-07-04 ENCOUNTER — Encounter (HOSPITAL_COMMUNITY): Payer: Self-pay | Admitting: Obstetrics & Gynecology

## 2021-07-04 ENCOUNTER — Inpatient Hospital Stay (HOSPITAL_COMMUNITY)
Admission: AD | Admit: 2021-07-04 | Discharge: 2021-07-07 | DRG: 807 | Disposition: A | Discharge status: Home / Self Care | Payer: Medicaid Other | Attending: Family Medicine | Admitting: Family Medicine

## 2021-07-04 ENCOUNTER — Other Ambulatory Visit: Payer: Self-pay

## 2021-07-04 DIAGNOSIS — O26893 Other specified pregnancy related conditions, third trimester: Secondary | ICD-10-CM | POA: Diagnosis not present

## 2021-07-04 DIAGNOSIS — Z3A40 40 weeks gestation of pregnancy: Secondary | ICD-10-CM | POA: Diagnosis not present

## 2021-07-04 DIAGNOSIS — Z20822 Contact with and (suspected) exposure to covid-19: Secondary | ICD-10-CM | POA: Diagnosis present

## 2021-07-04 DIAGNOSIS — O09521 Supervision of elderly multigravida, first trimester: Secondary | ICD-10-CM

## 2021-07-04 LAB — GLUCOSE, CAPILLARY
Glucose-Capillary: 107 mg/dL — ABNORMAL HIGH (ref 70–99)
Glucose-Capillary: 100 mg/dL — ABNORMAL HIGH (ref 70–99)
Glucose-Capillary: 108 mg/dL — ABNORMAL HIGH (ref 70–99)
Glucose-Capillary: 97 mg/dL (ref 70–99)

## 2021-07-04 LAB — CBC
HCT: 35.3 % — ABNORMAL LOW (ref 36.0–46.0)
Hemoglobin: 10.7 g/dL — ABNORMAL LOW (ref 12.0–15.0)
MCH: 24.4 pg — ABNORMAL LOW (ref 26.0–34.0)
MCHC: 30.3 g/dL (ref 30.0–36.0)
MCV: 80.6 fL (ref 80.0–100.0)
Platelets: 188 10*3/uL (ref 150–400)
RBC: 4.38 MIL/uL (ref 3.87–5.11)
RDW: 15.7 % — ABNORMAL HIGH (ref 11.5–15.5)
WBC: 10.6 10*3/uL — ABNORMAL HIGH (ref 4.0–10.5)
nRBC: 0 % (ref 0.0–0.2)

## 2021-07-04 LAB — TYPE AND SCREEN
ABO/RH(D): O POS
Antibody Screen: NEGATIVE

## 2021-07-04 LAB — RESP PANEL BY RT-PCR (FLU A&B, COVID) ARPGX2
Influenza A by PCR: NEGATIVE
Influenza B by PCR: NEGATIVE
SARS Coronavirus 2 by RT PCR: NEGATIVE

## 2021-07-04 LAB — RPR: RPR Ser Ql: NONREACTIVE

## 2021-07-04 MED ORDER — TERBUTALINE SULFATE 1 MG/ML IJ SOLN: 0.2500 mg | Freq: Once | INTRAMUSCULAR | Status: DC | PRN

## 2021-07-04 MED ORDER — OXYCODONE-ACETAMINOPHEN 5-325 MG PO TABS: 1.0000 | ORAL_TABLET | ORAL | Status: DC | PRN

## 2021-07-04 MED ORDER — LACTATED RINGERS IV SOLN: 500.0000 mL | INTRAVENOUS | Status: DC | PRN

## 2021-07-04 MED ORDER — OXYTOCIN-SODIUM CHLORIDE 30-0.9 UT/500ML-% IV SOLN
2.5000 [IU]/h | INTRAVENOUS | Status: DC
Start: 2021-07-04 — End: 2021-07-05
  Filled 2021-07-04: qty 500

## 2021-07-04 MED ORDER — OXYCODONE-ACETAMINOPHEN 5-325 MG PO TABS: 2.0000 | ORAL_TABLET | ORAL | Status: DC | PRN

## 2021-07-04 MED ORDER — FENTANYL CITRATE (PF) 100 MCG/2ML IJ SOLN
100.0000 ug | INTRAMUSCULAR | Status: DC | PRN
  Administered 2021-07-05 (×2): 100 ug via INTRAVENOUS
  Filled 2021-07-04 (×2): qty 2

## 2021-07-04 MED ORDER — LIDOCAINE HCL (PF) 1 % IJ SOLN: 30.0000 mL | INTRAMUSCULAR | Status: DC | PRN

## 2021-07-04 MED ORDER — SOD CITRATE-CITRIC ACID 500-334 MG/5ML PO SOLN: 30.0000 mL | ORAL | Status: DC | PRN

## 2021-07-04 MED ORDER — ONDANSETRON HCL 4 MG/2ML IJ SOLN
4.0000 mg | Freq: Four times a day (QID) | INTRAMUSCULAR | Status: DC | PRN
  Administered 2021-07-05: 4 mg via INTRAVENOUS
  Filled 2021-07-04: qty 2

## 2021-07-04 MED ORDER — OXYTOCIN BOLUS FROM INFUSION: 333.0000 mL | Freq: Once | INTRAVENOUS | Status: DC

## 2021-07-04 MED ORDER — MISOPROSTOL 50MCG HALF TABLET
50.0000 ug | ORAL_TABLET | Freq: Once | ORAL | Status: AC
  Administered 2021-07-04: 50 ug via ORAL
  Filled 2021-07-04: qty 1

## 2021-07-04 MED ORDER — ACETAMINOPHEN 325 MG PO TABS: 650.0000 mg | ORAL_TABLET | ORAL | Status: DC | PRN

## 2021-07-04 MED ORDER — FLEET ENEMA 7-19 GM/118ML RE ENEM: 1.0000 | ENEMA | RECTAL | Status: DC | PRN

## 2021-07-04 MED ORDER — LACTATED RINGERS IV SOLN
INTRAVENOUS | Status: DC
Start: 2021-07-04 — End: 2021-07-05

## 2021-07-04 MED ORDER — OXYTOCIN-SODIUM CHLORIDE 30-0.9 UT/500ML-% IV SOLN
1.0000 m[IU]/min | INTRAVENOUS | Status: DC
  Administered 2021-07-04: 2 m[IU]/min via INTRAVENOUS

## 2021-07-04 NOTE — Progress Notes (Signed)
Labor Progress Note ?Elizabeth Boyd is a 41 y.o. G5P4004 at [redacted]w[redacted]d presented for early labor ? ?S:  ?Patient reports contractions have spaced out but she feels like when she's on hands and knees she is feeling more ? ?O:  ?BP 110/62   Pulse 74   Temp 97.7 ?F (36.5 ?C) (Oral)   Resp 18   Ht 5\' 2"  (1.575 m)   Wt 79.2 kg   LMP 10/23/2020   SpO2 98%   BMI 31.93 kg/m?  ? ?Fetal Tracing: ? ?Baseline: 130 ?Variability: moderate ?Accels: 15x15 ?Decels: none ? ?Toco: occasional uc's ? ? ?CVE: Dilation: 3 ?Effacement (%): 50 ?Cervical Position: Posterior ?Station: Ballotable ?Presentation: Vertex ?Exam by:: [email or web address], CNM ? ? ?A&P: 41 y.o. 24 [redacted]w[redacted]d early labor ?#Labor: Some cervical effacement but essentially unchanged. Patient declines augmentation again. Discussed cytotec for augmentation and patient states she will consider it in 1 hour ?#Pain: per patient request ?#FWB: Cat 1 ?#GBS negative ? ?[redacted]w[redacted]d, CNM ?12:26 PM ? ?

## 2021-07-04 NOTE — Progress Notes (Signed)
Labor Progress Note ?COURTENY EGLER is a 41 y.o. G5P4004 at [redacted]w[redacted]d presented for early labor, AMA ? ?S:  ?Patient reports no contractions. Requesting augmentation ? ?O:  ?BP 97/62   Pulse 68   Temp 97.7 ?F (36.5 ?C) (Oral)   Resp 16   Ht 5\' 2"  (1.575 m)   Wt 79.2 kg   LMP 10/23/2020   SpO2 98%   BMI 31.93 kg/m?  ? ?Fetal Tracing: ? ?Baseline: 135 ?Variability: moderate ?Accels: 15x15 ?Decels: none ? ?Toco: none ? ? ?CVE: Dilation: 3 ?Effacement (%): 50 ?Cervical Position: Posterior ?Station: Ballotable ?Presentation: Vertex ?Exam by:: [email or web address], CNM ? ? ?A&P: 41 y.o. 24 [redacted]w[redacted]d early labor, AMA, possible GDM ?#Labor: Discussed options for augmentation including cytotec, pit, and AROM. Patient states she is trying to avoid pitocin and desires cytotec for now ?#Pain: per patient request ?#FWB: Cat 1 ?#GBS negative ? ?[redacted]w[redacted]d, CNM ?2:38 PM ? ?

## 2021-07-04 NOTE — MAU Note (Signed)
.  Elizabeth Boyd is a 41 y.o. at [redacted]w[redacted]d here in MAU reporting: ctx q 5 min since 11 pm last night.  Denies LOF or vag bleeding.  +FM ? ? ?Pain score: 6/10 ?There were no vitals filed for this visit.   ?FHT:150 ? ?

## 2021-07-04 NOTE — Plan of Care (Signed)
?  Problem: Education: ?Goal: Knowledge of Childbirth will improve ?Outcome: Progressing ?Goal: Ability to make informed decisions regarding treatment and plan of care will improve ?Outcome: Progressing ?Goal: Ability to state and carry out methods to decrease the pain will improve ?Outcome: Progressing ?  ?Problem: Coping: ?Goal: Ability to verbalize concerns and feelings about labor and delivery will improve ?Outcome: Progressing ?  ?Problem: Safety: ?Goal: Risk of complications during labor and delivery will decrease ?Outcome: Progressing ?  ?Problem: Pain Management: ?Goal: Relief or control of pain from uterine contractions will improve ?Outcome: Progressing ?  ?Problem: Education: ?Goal: Knowledge of General Education information will improve ?Description: Including pain rating scale, medication(s)/side effects and non-pharmacologic comfort measures ?Outcome: Progressing ?  ?Problem: Health Behavior/Discharge Planning: ?Goal: Ability to manage health-related needs will improve ?Outcome: Progressing ?  ?Problem: Clinical Measurements: ?Goal: Ability to maintain clinical measurements within normal limits will improve ?Outcome: Progressing ?Goal: Will remain free from infection ?Outcome: Progressing ?Goal: Diagnostic test results will improve ?Outcome: Progressing ?Goal: Respiratory complications will improve ?Outcome: Progressing ?Goal: Cardiovascular complication will be avoided ?Outcome: Progressing ?  ?Problem: Activity: ?Goal: Risk for activity intolerance will decrease ?Outcome: Progressing ?  ?Problem: Nutrition: ?Goal: Adequate nutrition will be maintained ?Outcome: Progressing ?  ?Problem: Coping: ?Goal: Level of anxiety will decrease ?Outcome: Progressing ?  ?Problem: Elimination: ?Goal: Will not experience complications related to bowel motility ?Outcome: Progressing ?Goal: Will not experience complications related to urinary retention ?Outcome: Progressing ?  ?Problem: Pain Managment: ?Goal: General  experience of comfort will improve ?Outcome: Progressing ?  ?Problem: Safety: ?Goal: Ability to remain free from injury will improve ?Outcome: Progressing ?  ?Problem: Skin Integrity: ?Goal: Risk for impaired skin integrity will decrease ?Outcome: Progressing ?  ?

## 2021-07-04 NOTE — Progress Notes (Addendum)
Patient sleeping upon reassessment. Declines intervention or augmentation. Will start nipple stimulation. Will reassess in 1 hour ? ?Rolm Bookbinder, CNM ?07/04/21 ?1:22 PM ? ?

## 2021-07-04 NOTE — MAU Note (Signed)
Started contracting last night at 1100, continued through the night.  Denies bleeding or ROM. No problems with pregnancy. Was closed when checked 2 wks ago. 4 prior vag deliveries.  ?

## 2021-07-04 NOTE — H&P (Addendum)
OBSTETRIC ADMISSION HISTORY AND PHYSICAL ? ?Elizabeth Boyd is a 41 y.o. female [phone] with IUP at 65w0dby 28 week ultrasound presenting for spontaneous onset of labor. She reports +FMs, No LOF, no VB, no blurry vision, headaches or peripheral edema, and RUQ pain.  She plans on breast feeding. She is unsure about what she wants to use for birth control. ?[facility]  ? ?Dating: By 28 week ultrasound --->  Estimated Date of Delivery: 07/04/21 ? ?Sono:   ? ?'@[redacted]w[redacted]d' , CWD, normal anatomy, cephalic presentation, [id number] 35% EFW ? ? ?Nursing Staff Provider  ?Office Location  [facility] Dating  07/30/21 by LMP  ?Language  English Anatomy [place]   ?Flu Vaccine  declined Genetic/Carrier Screen  NIPS:   Low risks ?AFP:    ?Horizon: SMN carier  ?TDaP Vaccine    Hgb A1C or  ?GTT Early  ?Third trimester   ?COVID Vaccine  none   LAB RESULTS   ?Rhogam   Blood Type O/Positive/-- (09/22 0945)   ?Baby Feeding Plan  BREASTFEED Antibody Negative (09/22 0945)  ?Contraception  UNSURE Rubella 4.61 (09/22 0945)  ?Circumcision YES IF BOY RPR Non Reactive (09/22 0945)   ?Pediatrician   SHILPA GOSRONI HBsAg Negative (09/22 0945)   ?Support Person  NONE HCVAb negative  ?Prenatal Classes  HIV Non Reactive (09/22 0945)     ?BTL Consent  GBS  negative ?(For PCN allergy, check sensitivities)   ?VBAC Consent  Pap ASCUS neg HPV 12/2020  ?     ?DME Rx '[ ]'  BP cuff ?'[ ]'  Weight Scale Waterbirth  '[ ]'  Class '[ ]'  Consent '[ ]'  CNM visit  ?PHQ9 & GAD7 [ x] new OB ?[  ] 28 weeks  ?[  ] 36 weeks Induction  '[ ]'  Orders Entered '[ ]' Foley Y/N  ? ?Prenatal History/Complications: AMA, schizoaffective disorder, intermittent prenatal care ? ?Past Medical History: ?Past Medical History:  ?Diagnosis Date  ? Medical history non-contributory   ? ? ?Past Surgical History: ?Past Surgical History:  ?Procedure Laterality Date  ? NO PAST SURGERIES    ? ? ?Obstetrical History: ?OB History   ? ? Gravida  ?5  ? Para  ?4  ? Term  ?4  ? Preterm  ?   ? AB  ?   ? Living   ?4  ?  ? ? SAB  ?   ? IAB  ?   ? Ectopic  ?   ? Multiple  ?   ? Live Births  ?4  ?   ?  ?  ? ? ?Social History ?Social History  ? ?Socioeconomic History  ? Marital status: Married  ?  Spouse name: Not on file  ? Number of children: Not on file  ? Years of education: Not on file  ? Highest education level: Not on file  ?Occupational History  ? Not on file  ?Tobacco Use  ? Smoking status: Never  ? Smokeless tobacco: Never  ?Vaping Use  ? Vaping Use: Never used  ?Substance and Sexual Activity  ? Alcohol use: No  ?  Alcohol/week: 0.0 standard drinks  ? Drug use: Not Currently  ?  Types: Marijuana  ? Sexual activity: Yes  ?  Partners: Male  ?  Birth control/protection: None  ?Other Topics Concern  ? Not on file  ?Social History Narrative  ? Not on file  ? ?Social Determinants of Health  ? ?Financial Resource Strain: Not on file  ?  Food Insecurity: Not on file  ?Transportation Needs: Not on file  ?Physical Activity: Not on file  ?Stress: Not on file  ?Social Connections: Not on file  ? ? ?Family History: ?Family History  ?Problem Relation Age of Onset  ? Diabetes Paternal Grandmother   ? Heart disease Neg Hx   ? Hypertension Neg Hx   ? Obesity Neg Hx   ? Stroke Neg Hx   ? ? ?Allergies: ?No Known Allergies ? ?Medications Prior to Admission  ?Medication Sig Dispense Refill Last Dose  ? Blood Pressure Monitoring (BLOOD PRESSURE KIT) DEVI 1 Device by Does not apply route once a week. (Patient not taking: Reported on 06/09/2021) 1 each 0   ? loratadine (CLARITIN) 10 MG tablet Take 1 tablet (10 mg total) by mouth daily. (Patient not taking: Reported on 01/14/2021) 30 tablet 11   ? Prenatal Vit-Iron Carbonyl-FA (PRENATAL PLUS IRON) 29-1 MG TABS Take 1 tablet by mouth daily before breakfast. 90 tablet 4   ? ?Review of Systems  ? ?All systems reviewed and negative except as stated in HPI ? ?Blood pressure 110/66, pulse 79, temperature 97.7 ?F (36.5 ?C), temperature source Oral, resp. rate 17, last menstrual period 10/23/2020, SpO2  98 %. ?General appearance: alert, cooperative, and no distress ?Lungs: clear to auscultation bilaterally ?Heart: regular rate and rhythm ?Abdomen: soft, non-tender; bowel sounds normal ?Pelvic: n/a ?Extremities: Homans sign is negative, no sign of DVT ?DTR's +2 ?Presentation: cephalic ?Fetal monitoring: Baseline: 125 bpm, Variability: Good {> 6 bpm), Accelerations: Reactive, and Decelerations: Absent ?Uterine activityFrequency: Every 5-7 minutes ?Dilation: 3 (ext 4) ?Effacement (%): 50 ?Station: Ballotable ?Exam by:: jolynn ? ? ?Prenatal labs: ?ABO, Rh: O/Positive/-- (09/22 0945) ?Antibody: Negative (09/22 0945) ?Rubella: 4.61 (09/22 0945) ?RPR: Non Reactive (02/02 1619)  ?HBsAg: Negative (09/22 0945)  ?HIV: Non Reactive (02/02 1619)  ?GBS: Negative/-- (02/16 1110)  ? ?Prenatal Transfer Tool  ?Maternal Diabetes: elevated A1C- refused GTT ?Genetic Screening: Normal ?Maternal Ultrasounds/Referrals: Normal ?Fetal Ultrasounds or other Referrals:  Referred to [facility]  ?Maternal Substance Abuse:  No ?Significant Maternal Medications:  None ?Significant Maternal Lab Results: Group B Strep negative ? ?No results found for this or any previous visit (from the past 24 hour(s)). ? ?Patient Active Problem List  ? Diagnosis Date Noted  ? Supervision of elderly multigravida in first trimester 01/14/2021  ? Hyperprolactinemia ([place]) 06/26/2015  ? Schizoaffective disorder, bipolar type ([place]) 06/25/2015  ? Cannabis use disorder, severe, dependence ([place]) 06/24/2015  ? Vitiligo 06/24/2015  ? Pelvic pain in female 01/12/2015  ? ? ?Assessment/Plan:  ?Elizabeth Boyd is a 41 y.o. G5P4004 at 69w0dhere for spontaneous onset of labor ? ?#Labor: expectant management for now. Discussed nipple stimulation and position changes in the mean time. Will recheck in 2 hours.  ? ?Given elevated A1C with no GTT or at home testing, will treat as possible GDM and  check CBGs q4h.  ? ?Discussed with patient need for augmentation if no cervical  change given possible GDM at term ? ?#Pain: Per patient request ?#FWB: Cat 1 ?#ID:  GBS neg ?#MOF: breast ?#MOC: unsure ?#Circ:  yes ? ?CWende Mott CNM  ?07/04/2021, 8:24 AM ? ? ? ? ?

## 2021-07-04 NOTE — Progress Notes (Signed)
Labor Progress Note ?[name] is a 41 y.o. G5P4004 at [redacted]w[redacted]d presented for IOL for early labor, AMA ? ?S:  ?Patient resting comfortably ? ?O:  ?BP (!) 98/47   Pulse 81   Temp 97.8 ?F (36.6 ?C) (Oral)   Resp 20   Ht 5\' 2"  (1.575 m)   Wt 79.2 kg   LMP 10/23/2020   SpO2 98%   BMI 31.93 kg/m?  ? ?Fetal Tracing: ? ?Baseline: 125 ?Variability: moderate ?Accels: 15x15 ?Decels: none ? ?Toco: occasoinal uc's ? ?CVE: Dilation: 4 ?Effacement (%): 50 ?Cervical Position: Posterior ?Station: -3, Ballotable ?Presentation: Vertex ?Exam by:: cNeil,cnm ? ? ?A&P: 41 y.o. 24 [redacted]w[redacted]d IOL early labor, AMA, possible GDM- untested ?#Labor: Some cervical change after cytotec.  ? ?Discussed with patient starting pitocin to get contractions closer together now that cervix is favorable and AROM when cervix is less posterior and baby is better engaged in the pelvis. Patient declines ? ?Offered another cytotec for more thinning and patient declines.  ? ?She states she wants to get up and walk around and go back to doing nipple stimulation because she thinks that works better. Discussed with patient that cervical change happened after medical intervention instead of previous attempts at natural augmentation. Patient verbalized understanding and states if her baby is ok, she does not want to do anything but walk for now.  ? ?#Pain: per patient request ?#FWB: Cat 1 ?#GBS negative ? ?[redacted]w[redacted]d, CNM ?6:57 PM ? ?

## 2021-07-04 NOTE — Progress Notes (Signed)
OB/GYN Faculty Practice: Labor Progress Note ? ?Subjective: Pt up on ball, reporting that she is not feeling any contractions ? ? ?Objective: BP 115/66 (BP Location: Right Arm)   Pulse 76   Temp 98.1 ?F (36.7 ?C) (Oral)   Resp 18   Ht 5\' 2"  (1.575 m)   Wt 79.2 kg   LMP 10/23/2020   SpO2 98%   BMI 31.93 kg/m?  ?Gen: no acute distress ? ?FHT: 130, moderate variability, +accels, no decels ?Toco: occasional ?SVE: deferred ?Bedside US confirmed vertex ? ?Assessment and Plan: 41 y.o. [id number] [redacted]w[redacted]d presented in latent labor ? ?Labor: start Pit per protocol ?-- pain control: IV or epidural upon request ? ?Fetal well being- Cat. I ?-- GBS (neg)  ? ?Otis Peak, DO ?8:38 PM  ?

## 2021-07-05 ENCOUNTER — Encounter (HOSPITAL_COMMUNITY): Payer: Self-pay | Admitting: Obstetrics & Gynecology

## 2021-07-05 DIAGNOSIS — Z3A40 40 weeks gestation of pregnancy: Secondary | ICD-10-CM

## 2021-07-05 LAB — GLUCOSE, CAPILLARY: Glucose-Capillary: 77 mg/dL (ref 70–99)

## 2021-07-05 MED ORDER — BENZOCAINE-MENTHOL 20-0.5 % EX AERO: 1.0000 "application " | INHALATION_SPRAY | CUTANEOUS | Status: DC | PRN

## 2021-07-05 MED ORDER — PRENATAL MULTIVITAMIN CH
1.0000 | ORAL_TABLET | Freq: Every day | ORAL | Status: DC
  Administered 2021-07-05 – 2021-07-07 (×3): 1 via ORAL
  Filled 2021-07-05 (×3): qty 1

## 2021-07-05 MED ORDER — DIBUCAINE (PERIANAL) 1 % EX OINT: 1.0000 "application " | TOPICAL_OINTMENT | CUTANEOUS | Status: DC | PRN

## 2021-07-05 MED ORDER — ONDANSETRON HCL 4 MG PO TABS: 4.0000 mg | ORAL_TABLET | ORAL | Status: DC | PRN

## 2021-07-05 MED ORDER — SIMETHICONE 80 MG PO CHEW: 80.0000 mg | CHEWABLE_TABLET | ORAL | Status: DC | PRN

## 2021-07-05 MED ORDER — ZOLPIDEM TARTRATE 5 MG PO TABS: 5.0000 mg | ORAL_TABLET | Freq: Every evening | ORAL | Status: DC | PRN

## 2021-07-05 MED ORDER — IBUPROFEN 600 MG PO TABS
600.0000 mg | ORAL_TABLET | Freq: Four times a day (QID) | ORAL | Status: DC
  Administered 2021-07-05 – 2021-07-07 (×7): 600 mg via ORAL
  Filled 2021-07-05 (×8): qty 1

## 2021-07-05 MED ORDER — ONDANSETRON HCL 4 MG/2ML IJ SOLN: 4.0000 mg | INTRAMUSCULAR | Status: DC | PRN

## 2021-07-05 MED ORDER — DIPHENHYDRAMINE HCL 25 MG PO CAPS: 25.0000 mg | ORAL_CAPSULE | Freq: Four times a day (QID) | ORAL | Status: DC | PRN

## 2021-07-05 MED ORDER — WITCH HAZEL-GLYCERIN EX PADS: 1.0000 "application " | MEDICATED_PAD | CUTANEOUS | Status: DC | PRN

## 2021-07-05 MED ORDER — COCONUT OIL OIL: 1.0000 "application " | TOPICAL_OIL | Status: DC | PRN

## 2021-07-05 MED ORDER — SENNOSIDES-DOCUSATE SODIUM 8.6-50 MG PO TABS
2.0000 | ORAL_TABLET | Freq: Every day | ORAL | Status: DC
  Administered 2021-07-06 – 2021-07-07 (×2): 2 via ORAL
  Filled 2021-07-05 (×2): qty 2

## 2021-07-05 MED ORDER — TETANUS-DIPHTH-ACELL PERTUSSIS 5-2.5-18.5 LF-MCG/0.5 IM SUSY: 0.5000 mL | PREFILLED_SYRINGE | Freq: Once | INTRAMUSCULAR | Status: DC

## 2021-07-05 MED ORDER — ACETAMINOPHEN 325 MG PO TABS: 650.0000 mg | ORAL_TABLET | ORAL | Status: DC | PRN

## 2021-07-05 NOTE — Progress Notes (Signed)
LABOR PROGRESS NOTE ? ?Elizabeth Boyd is a 41 y.o. [id number] at [redacted]w[redacted]d  admitted for latent labor ? ?Subjective: ?Pt feeling strong contractions- paused Pitocin on her own ? ?Objective: ?BP 109/69   Pulse 68   Temp 98.6 ?F (37 ?C) (Oral)   Resp 17   Ht 5\' 2"  (1.575 m)   Wt 79.2 kg   LMP 10/23/2020   SpO2 98%   BMI 31.93 kg/m?  or  ?Vitals:  ? 07/04/21 2132 07/04/21 2250 07/04/21 2348 07/05/21 0059  ?BP: (!) 111/59 107/61 117/66 109/69  ?Pulse: 74 78 64 68  ?Resp: 17 16 17    ?Temp:  98.6 ?F (37 ?C)    ?TempSrc:  Oral    ?SpO2:      ?Weight:      ?Height:      ? ? ?Gen: NAD ? ?FHT: baseline rate 130, moderate varibility, no accels no decels ?Toco: q19min ?SVE: Deferred ? ?Labs: ?Lab Results  ?Component Value Date  ? WBC 10.6 (H) 07/04/2021  ? HGB 10.7 (L) 07/04/2021  ? HCT 35.3 (L) 07/04/2021  ? MCV 80.6 07/04/2021  ? PLT 188 07/04/2021  ? ? ? ?Assessment / Plan: ?41 y.o. G5P4004 at [redacted]w[redacted]d here for latent labor ? ?Labor: discussed continued augmentation and reviewed with patient that if Pitocin discontinued it will likely stall labor and stop contractions.  Also discussed further augmentation with AROM if/when needed.  Questions and concerns were addressed- agreeable to continue with Pitocin ?Fetal Wellbeing:  Cat. I ?Pain Control:  IV or epidural upon request ?GBS status: neg ? ? ?Janyth Pupa, DO ?Attending [place], Faculty Practice ?Center for [place] ? ?  ?

## 2021-07-05 NOTE — Discharge Summary (Shared)
Postpartum Discharge Summary  Date of Service updated***     Patient Name: Elizabeth Boyd DOB: 1980/09/25 MRN: [id number]  Date of admission: 07/04/2021 Delivery date:07/05/2021  Delivering provider: Truett Mainland  Date of discharge: 07/05/2021  Admitting diagnosis: Normal labor [O80, Z37.9] Intrauterine pregnancy: [redacted]w[redacted]d    Secondary diagnosis:  Principal Problem:   Normal labor  Additional problems: none    Discharge diagnosis: Term Pregnancy Delivered                                              Post partum procedures: none Augmentation: AROM Complications: None  Hospital course: Onset of Labor With Vaginal Delivery      41y.o. yo [id number] 477w1das admitted in Active Labor on 07/04/2021. Patient had an uncomplicated labor course as follows:  Membrane Rupture Time/Date: 6:21 AM ,07/05/2021   Delivery Method:Vaginal, Spontaneous  Episiotomy: None  Lacerations:  None  Patient had an uncomplicated postpartum course.  She is ambulating, tolerating a regular diet, passing flatus, and urinating well. Patient is discharged home in stable condition on 07/05/21.  Newborn Data: Birth date:07/05/2021  Birth time:7:57 AM  Gender:Female  Living status:Living  Apgars:9 ,9  Weight:   Magnesium Sulfate received: No BMZ received: No Rhophylac:N/A MMR:No T-DaP: none Flu: No Transfusion:No  Physical exam  Vitals:   07/05/21 0734 07/05/21 0807 07/05/21 0817 07/05/21 0831  BP: 140/78 123/68 110/64 113/65  Pulse: 64 79 71 (!) 109  Resp:      Temp:      TempSrc:      SpO2:      Weight:      Height:       General: {Exam; general:21111117} Lochia: {Desc; appropriate/inappropriate:30686::"appropriate"} Uterine Fundus: {Desc; firm/soft:30687} Incision: {Exam; incision:21111123} DVT Evaluation: {Exam; dvt:2111122} Labs: Lab Results  Component Value Date   WBC 10.6 (H) 07/04/2021   HGB 10.7 (L) 07/04/2021   HCT 35.3 (L) 07/04/2021   MCV 80.6 07/04/2021   PLT 188  07/04/2021   CMP Latest Ref Rng & Units 04/02/2020  Glucose 65 - 99 mg/dL 86  BUN 6 - 20 mg/dL 8  Creatinine 0.57 - 1.00 mg/dL 0.73  Sodium 134 - 144 mmol/L 142  Potassium 3.5 - 5.2 mmol/L 4.0  Chloride 96 - 106 mmol/L 108(H)  CO2 20 - 29 mmol/L 22  Calcium 8.7 - 10.2 mg/dL 9.4  Total Protein 6.0 - 8.5 g/dL 6.6  Total Bilirubin 0.0 - 1.2 mg/dL 0.3  Alkaline Phos 44 - 121 IU/L 51  AST 0 - 40 IU/L 13  ALT 0 - 32 IU/L 21   Edinburgh Score: No flowsheet data found.   After visit meds:  Allergies as of 07/05/2021   No Known Allergies   Med Rec must be completed prior to [facility]        Discharge home in stable condition Infant Feeding: {Baby feeding:23562} Infant Disposition:{CHL IP OB HOME WITH MOFKCLEX:51700}ischarge instruction: per After Visit Summary and Postpartum booklet. Activity: Advance as tolerated. Pelvic rest for 6 weeks.  Diet: {OB diFVCB:44967591}uture Appointments:No future appointments. Follow up Visit:   Please schedule this patient for a In person postpartum visit in 4 weeks with the following provider: Any provider. Additional Postpartum F/U: none   Low risk pregnancy complicated by:  none Delivery mode:  Vaginal, Spontaneous  Anticipated Birth Control:  Unsure   07/05/2021 Truett Mainland, DO

## 2021-07-05 NOTE — Progress Notes (Signed)
LABOR PROGRESS NOTE ? ?Elizabeth Boyd is a 41 y.o. J1H4174 at [redacted]w[redacted]d  admitted for latent labor ? ?Subjective: ?Becoming more uncomfortable ? ?Objective: ?BP 109/73   Pulse 70   Temp 97.9 ?F (36.6 ?C) (Oral)   Resp 19   Ht 5\' 2"  (1.575 m)   Wt 79.2 kg   LMP 10/23/2020   SpO2 98%   BMI 31.93 kg/m?  or  ?Vitals:  ? 07/04/21 2348 07/05/21 0059 07/05/21 0209 07/05/21 0345  ?BP: 117/66 109/69 118/71 109/73  ?Pulse: 64 68 71 70  ?Resp: 17 18 18 19   ?Temp:    97.9 ?F (36.6 ?C)  ?TempSrc:    Oral  ?SpO2:      ?Weight:      ?Height:      ? ? ?FHT: baseline rate 130, moderate varibility, + accel, no decel ?Toco: q12min ?SVE: 3-4/50/-3, attempted AROM- blood tinged fluid ? ?Labs: ?Lab Results  ?Component Value Date  ? WBC 10.6 (H) 07/04/2021  ? HGB 10.7 (L) 07/04/2021  ? HCT 35.3 (L) 07/04/2021  ? MCV 80.6 07/04/2021  ? PLT 188 07/04/2021  ? ? ? ?Assessment / Plan: ?41 y.o. G5P4004 at [redacted]w[redacted]d here for latent labor ? ?Labor: continue Pit per protocol ?Fetal Wellbeing:  Cat. I ?Pain Control:  IV or epidural upon request ?GBS status: neg ? ? ?24, DO ?Attending Obstetrician & Gynecologist, Faculty Practice ?Center for [redacted]w[redacted]d, [facility] ? ?  ?

## 2021-07-06 LAB — CBC
HCT: 29.7 % — ABNORMAL LOW (ref 36.0–46.0)
Hemoglobin: 9.6 g/dL — ABNORMAL LOW (ref 12.0–15.0)
MCH: 25.1 pg — ABNORMAL LOW (ref 26.0–34.0)
MCHC: 32.3 g/dL (ref 30.0–36.0)
MCV: 77.7 fL — ABNORMAL LOW (ref 80.0–100.0)
Platelets: 164 10*3/uL (ref 150–400)
RBC: 3.82 MIL/uL — ABNORMAL LOW (ref 3.87–5.11)
RDW: 15.8 % — ABNORMAL HIGH (ref 11.5–15.5)
WBC: 11.3 10*3/uL — ABNORMAL HIGH (ref 4.0–10.5)
nRBC: 0 % (ref 0.0–0.2)

## 2021-07-06 MED ORDER — IBUPROFEN 800 MG PO TABS: 800.0000 mg | ORAL_TABLET | Freq: Three times a day (TID) | ORAL | 0 refills | Status: DC | PRN

## 2021-07-06 MED ORDER — FERROUS SULFATE 325 (65 FE) MG PO TABS: 325.0000 mg | ORAL_TABLET | Freq: Every day | ORAL | 3 refills | Status: AC
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
## 2021-07-08 ENCOUNTER — Telehealth: Payer: Self-pay

## 2021-07-08 NOTE — Telephone Encounter (Signed)
Transition Care Management Unsuccessful Follow-up Telephone Call ? ?Date of discharge and from where:  07/07/2021-[facility]  ? ?Attempts:  1st Attempt ? ?Reason for unsuccessful TCM follow-up call:  Left voice message ? ?  ?

## 2021-07-09 NOTE — Telephone Encounter (Signed)
Transition Care Management Unsuccessful Follow-up Telephone Call ? ?Date of discharge and from where:  07/07/2021-[facility] ? ?Attempts:  2nd Attempt ? ?Reason for unsuccessful TCM follow-up call:  Left voice message ? ?  ?

## 2021-07-13 NOTE — Telephone Encounter (Signed)
Transition Care Management Unsuccessful Follow-up Telephone Call ? ?Date of discharge and from where:  07/07/2021-[facility]  ? ?Attempts:  3rd Attempt ? ?Reason for unsuccessful TCM follow-up call:  Left voice message ? ?  ?

## 2021-07-15 ENCOUNTER — Telehealth (HOSPITAL_COMMUNITY): Payer: Self-pay | Admitting: *Deleted

## 2021-07-15 NOTE — Telephone Encounter (Signed)
Left phone voicemail message. ? ?Odis Hollingshead, RN 07-15-2021 at 11:55am ?

## 2021-08-04 ENCOUNTER — Ambulatory Visit: Payer: Medicaid Other | Admitting: Obstetrics and Gynecology

## 2021-08-27 ENCOUNTER — Ambulatory Visit (INDEPENDENT_AMBULATORY_CARE_PROVIDER_SITE_OTHER): Payer: Medicaid Other | Admitting: Obstetrics and Gynecology

## 2021-08-27 ENCOUNTER — Encounter: Payer: Self-pay | Admitting: Obstetrics and Gynecology

## 2021-08-27 DIAGNOSIS — R87610 Atypical squamous cells of undetermined significance on cytologic smear of cervix (ASC-US): Secondary | ICD-10-CM | POA: Insufficient documentation

## 2021-08-27 NOTE — Patient Instructions (Signed)

## 2021-08-27 NOTE — Progress Notes (Signed)
.. ? ? ?  Post Partum Visit Note ? ?Elizabeth Boyd is a 41 y.o. [id number] female who presents for a postpartum visit. She is 7 week postpartum following a normal spontaneous vaginal delivery.  I have fully reviewed the prenatal and intrapartum course. The delivery was at 40.1 gestational weeks.  Anesthesia: none. Postpartum course has been good. Baby is doing well. Baby is feeding by both breast and bottle - Similac Advance. Bleeding no bleeding. Bowel function is normal. Bladder function is normal. Patient is not sexually active. Contraception method is none. Postpartum depression screening: negative.  ? ? ?The pregnancy intention screening data noted above was reviewed. Potential methods of contraception were discussed. The patient elected to proceed with No data recorded. ? ? ? ?Health Maintenance Due  ?Topic Date Due  ? COVID-19 Vaccine (1) Never done  ? TETANUS/TDAP  Never done  ? ? ?The following portions of the patient's history were reviewed and updated as appropriate: allergies, current medications, past family history, past medical history, past social history, past surgical history, and problem list. ? ?Review of Systems ?Pertinent items noted in HPI and remainder of comprehensive ROS otherwise negative. ? ?Objective:  ?LMP 10/23/2020   ? ?General:  alert  ? Breasts:  not indicated  ?Lungs: clear to auscultation bilaterally  ?Heart:  regular rate and rhythm, S1, S2 normal, no murmur, click, rub or gallop  ?Abdomen: soft, non-tender; bowel sounds normal; no masses,  no organomegaly   ?Wound NA  ?GU exam:  not indicated  ?     ?Assessment:  ? ? There are no diagnoses linked to this encounter. ? ?NL postpartum exam.  ? ?Plan:  ? ?Essential components of care per ACOG recommendations: ? ?1.  Mood and well being: Patient with negative depression screening today. Reviewed local resources for support.  ?- Patient tobacco use? No.   ?- hx of drug use? No.   ? ?2. Infant care and feeding:  ?-Patient currently breastmilk  feeding? Yes ?-Social determinants of health (SDOH) reviewed in EPIC. No concerns ? ?3. Sexuality, contraception and birth spacing ?- Patient does not want a pregnancy in the next year.  Desired family size is uncertain   ?- Reviewed reproductive life planning. Reviewed contraceptive methods based on pt preferences and effectiveness.  Patient desired Hormonal Implant. Declines insertion today. Will call and schedule. Advised to use condoms at the minimum.  ?- Discussed birth spacing of 18 months ? ?4. Sleep and fatigue ?-Encouraged family/partner/community support of 4 hrs of uninterrupted sleep to help with mood and fatigue ? ?5. Physical Recovery  ?- Discussed patients delivery and complications. She describes her labor as good. ?- Patient had a Vaginal, no problems at delivery. Patient had a 1st degree laceration. Perineal healing reviewed. Patient expressed understanding ?- Patient has urinary incontinence? No. ?- Patient is safe to resume physical and sexual activity ? ?6.  Health Maintenance ?- HM due items addressed Yes ?- Last pap smear  ?Diagnosis  ?Date Value Ref Range Status  ?04/02/2020 (A)  Final  ? - Atypical squamous cells of undetermined significance (ASC-US)  ? Pap smear not done at today's visit.  ?-Breast Cancer screening indicated? No.  ? ?7. Chronic Disease/Pregnancy Condition follow up: None ? ?Repeat pap 12/24 as per ASCCP ?Pt to call and schedule Implanon insertion ? ?Nettie Elm, MD ?Center for [facility], [facility] ? ?

## 2022-08-17 ENCOUNTER — Ambulatory Visit (INDEPENDENT_AMBULATORY_CARE_PROVIDER_SITE_OTHER): Payer: Medicaid Other | Admitting: *Deleted

## 2022-08-17 ENCOUNTER — Other Ambulatory Visit (HOSPITAL_COMMUNITY)
Admission: RE | Admit: 2022-08-17 | Discharge: 2022-08-17 | Disposition: A | Discharge status: Home / Self Care | Payer: Medicaid Other | Source: Ambulatory Visit | Attending: Obstetrics and Gynecology | Admitting: Obstetrics and Gynecology

## 2022-08-17 VITALS — BP 113/71 | HR 78

## 2022-08-17 DIAGNOSIS — Z113 Encounter for screening for infections with a predominantly sexual mode of transmission: Secondary | ICD-10-CM | POA: Diagnosis not present

## 2022-08-17 NOTE — Progress Notes (Signed)
SUBJECTIVE:  42 y.o. female who desires a STI screen. Denies abnormal vaginal discharge, bleeding or significant pelvic pain. No UTI symptoms. Denies history of known exposure to STD.  No LMP recorded.  OBJECTIVE:  She appears well.   ASSESSMENT:  STI Screen   PLAN:  Pt offered STI blood screening-requested GC, chlamydia, and trichomonas probe sent to lab.  Treatment: To be determined once lab results are received.  Pt follow up as needed.

## 2022-08-18 LAB — CERVICOVAGINAL ANCILLARY ONLY
Bacterial Vaginitis (gardnerella): POSITIVE — AB
Candida Glabrata: POSITIVE — AB
Candida Vaginitis: NEGATIVE
Chlamydia: NEGATIVE
Comment: NEGATIVE
Comment: NEGATIVE
Comment: NEGATIVE
Comment: NEGATIVE
Comment: NEGATIVE
Comment: NORMAL
Neisseria Gonorrhea: NEGATIVE
Trichomonas: NEGATIVE

## 2022-08-18 LAB — RPR: RPR Ser Ql: NONREACTIVE

## 2022-08-18 LAB — HEPATITIS B SURFACE ANTIGEN: Hepatitis B Surface Ag: NEGATIVE

## 2022-08-18 LAB — HEPATITIS C ANTIBODY: Hep C Virus Ab: NONREACTIVE

## 2022-08-18 LAB — HIV ANTIBODY (ROUTINE TESTING W REFLEX): HIV Screen 4th Generation wRfx: NONREACTIVE

## 2022-08-22 ENCOUNTER — Other Ambulatory Visit: Payer: Self-pay

## 2022-08-22 DIAGNOSIS — B9689 Other specified bacterial agents as the cause of diseases classified elsewhere: Secondary | ICD-10-CM

## 2022-08-22 DIAGNOSIS — B379 Candidiasis, unspecified: Secondary | ICD-10-CM

## 2022-08-22 MED ORDER — METRONIDAZOLE 500 MG PO TABS: 500.0000 mg | ORAL_TABLET | Freq: Two times a day (BID) | ORAL | 0 refills | Status: AC

## 2022-08-22 MED ORDER — FLUCONAZOLE 150 MG PO TABS: 150.0000 mg | ORAL_TABLET | Freq: Once | ORAL | 0 refills | Status: AC

## 2022-11-07 IMAGING — US US MFM OB DETAIL+14 WK
1 series · 13 of 28 positions shown · non-contrast
Comparison: none

[Series 1: us mfm ob detail+14 wk · 77 acquisitions, 13 frames shown]
[im 3/77]
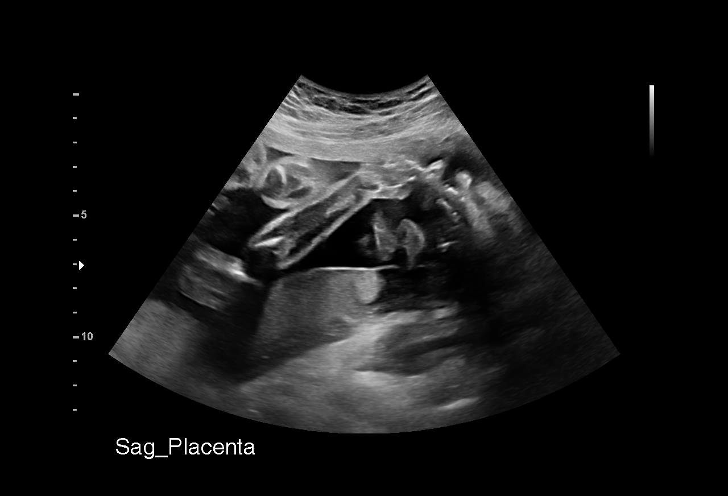
[im 9/77]
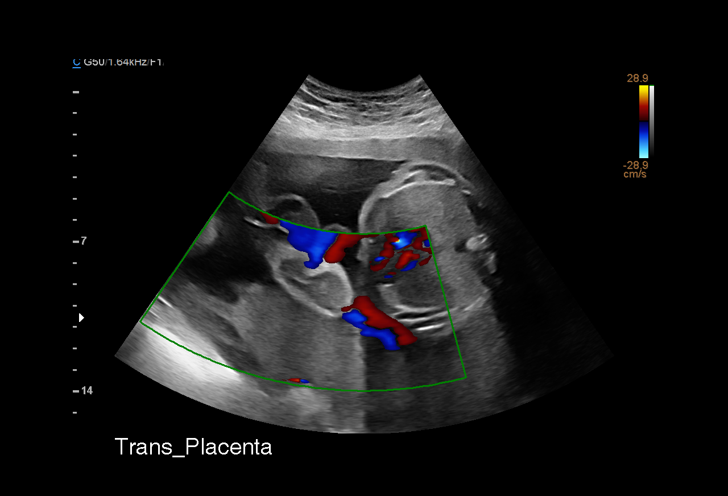
[im 15/77]
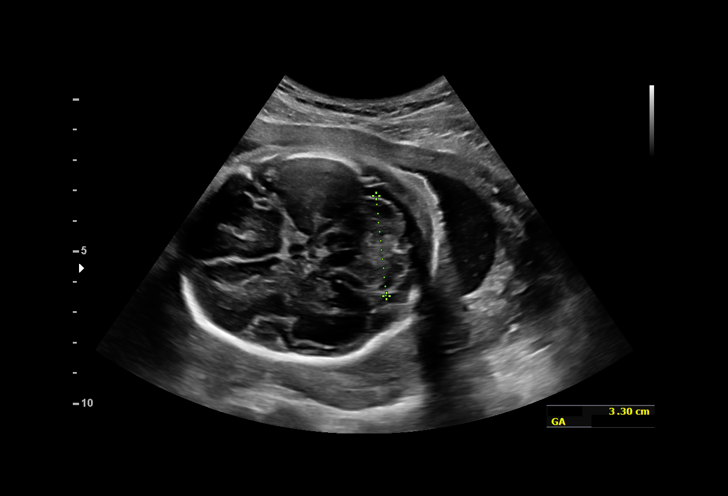
[im 20/77]
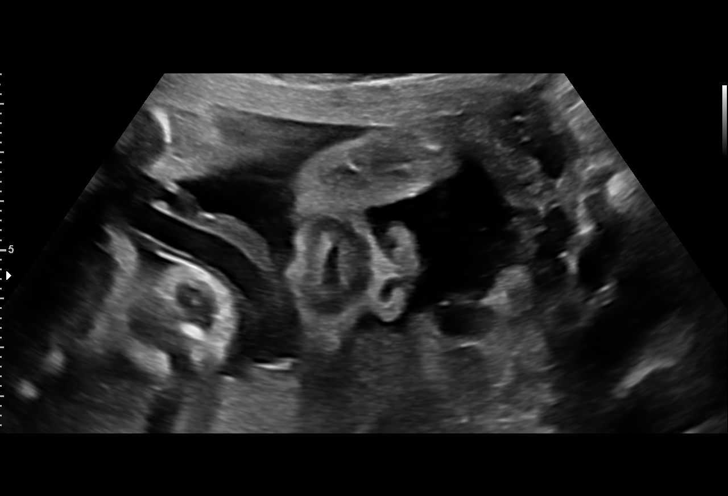
[im 26/77]
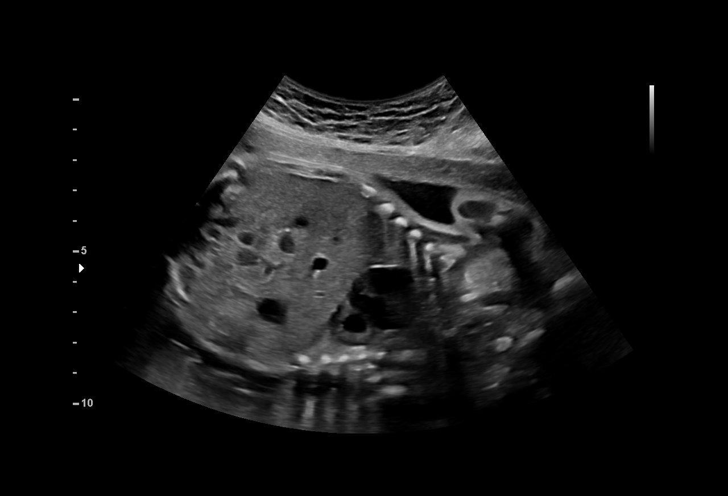
[im 31/77]
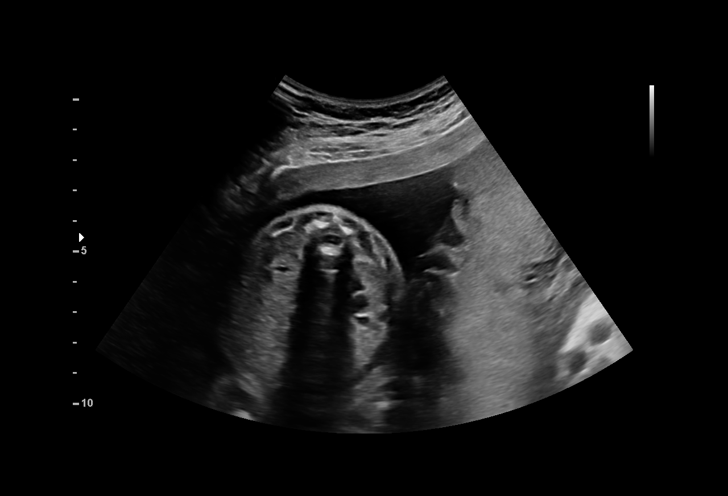
[im 40/77]
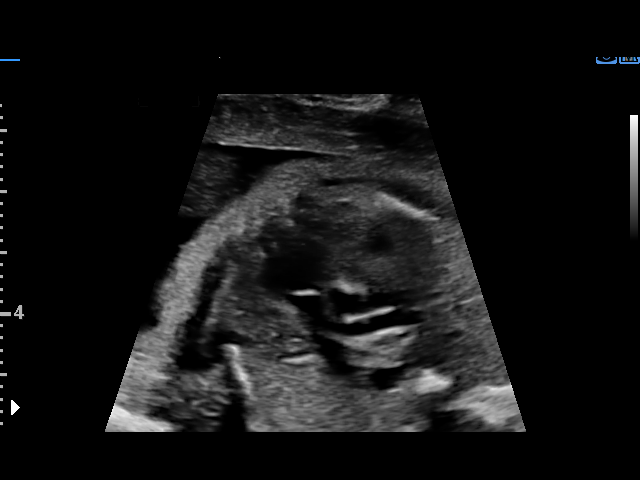
[im 46/77]
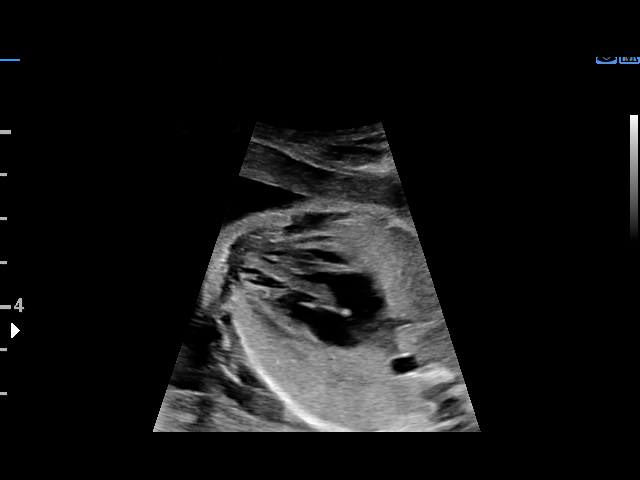
[im 51/77]
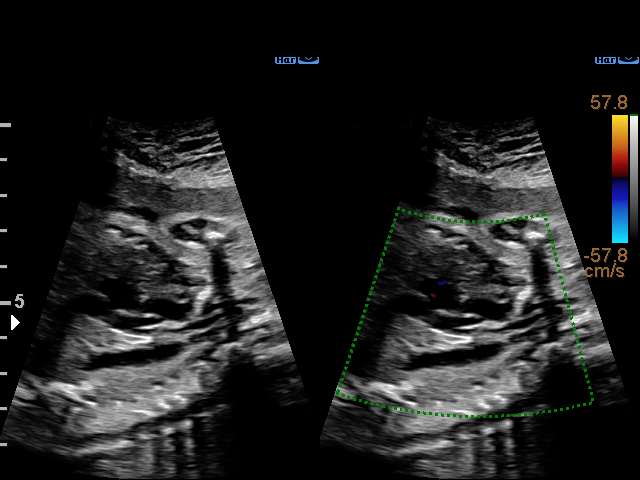
[im 57/77]
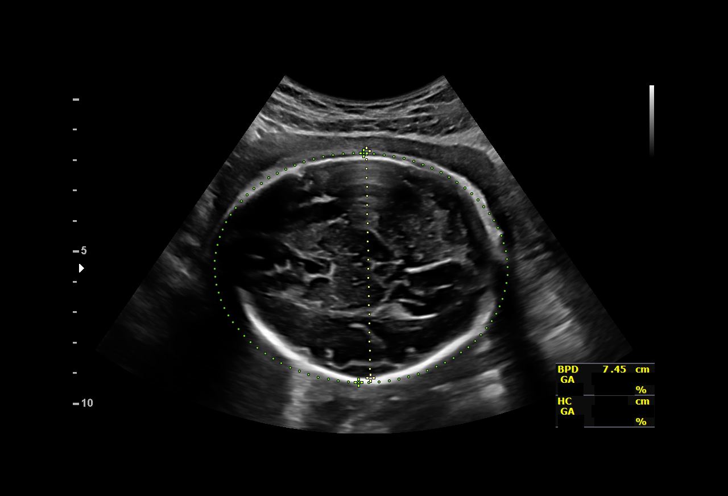
[im 62/77]
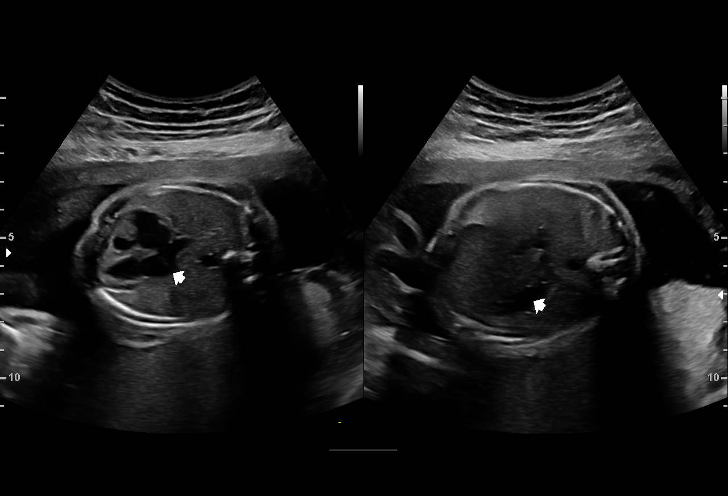
[im 68/77]
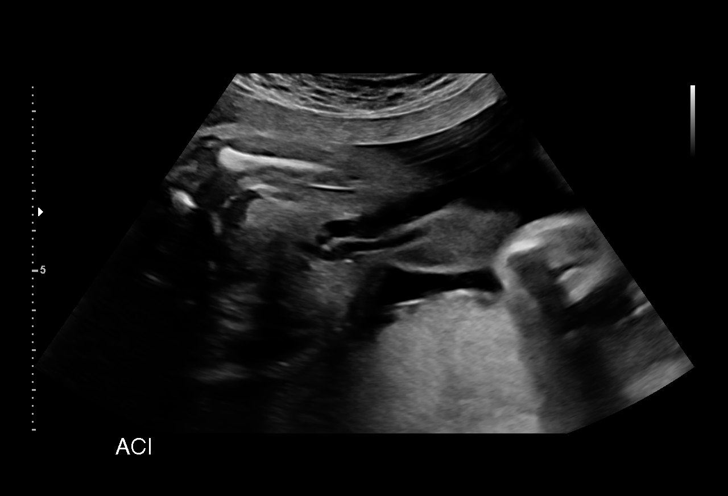
[im 74/77]
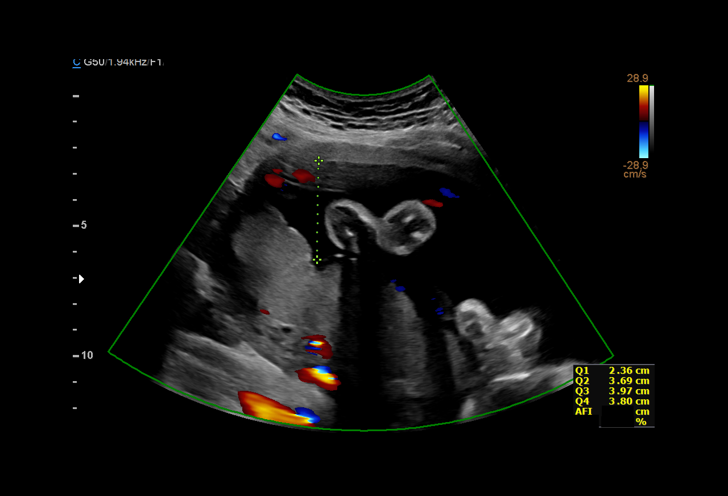

[13 of 28 positions shown; findings below may reference images not displayed]

[id number]

Indications

 Advanced maternal age multigravida 35+,
 third trimester (40yo)
 Genetic carrier (Increased Carrier Risk for
 SMA)
 Encounter for antenatal screening for
 malformations
 28 weeks gestation of pregnancy
 Low Risk NIPS
Fetal Evaluation

 Num Of Fetuses:         1
 Fetal Heart Rate(bpm):  141
 Cardiac Activity:       Observed
 Presentation:           Cephalic
 Placenta:               Posterior
 P. Cord Insertion:      Visualized

 Amniotic Fluid
 AFI FV:      Within normal limits

 AFI Sum(cm)     %Tile       Largest Pocket(cm)
 13.8            44          4.
 RUQ(cm)       RLQ(cm)       LUQ(cm)        LLQ(cm)
 2.4           3.8           3.7            4
Biometry

 BPD:      73.6  mm     G. Age:  29w 4d         73  %    CI:        73.34   %    70 - 86
                                                         FL/HC:      18.6   %    18.8 -
 HC:      273.1  mm     G. Age:  29w 6d         62  %    HC/AC:      1.20        1.05 -
 AC:      227.2  mm     G. Age:  27w 1d         10  %    FL/BPD:     68.9   %    71 - 87
 FL:       50.7  mm     G. Age:  27w 1d          9  %    FL/AC:      22.3   %    20 - 24
 HUM:      47.2  mm     G. Age:  27w 5d         31  %
 CER:        33  mm     G. Age:  28w 1d         43  %
 LV:        5.1  mm
 CM:        5.9  mm

 Est. FW:    1181  gm      2 lb 6 oz     12  %
OB History

 Gravidity:    5         Term:   4        Prem:   0        SAB:   0
 TOP:          0       Ectopic:  0        Living: 4
Gestational Age

 LMP:           24w 5d        Date:  10/23/20                 EDD:   07/30/21
 U/S Today:     28w 3d                                        EDD:   07/04/21
 Best:          28w 3d     Det. By:  U/S (04/14/21)           EDD:   07/04/21
Anatomy

 Cranium:               Appears normal         LVOT:                   Appears normal
 Cavum:                 Appears normal         Aortic Arch:            Appears normal
 Ventricles:            Appears normal         Ductal Arch:            Appears normal
 Choroid Plexus:        Appears normal         Diaphragm:              Appears normal
 Cerebellum:            Appears normal         Stomach:                Appears normal, left
                                                                       sided
 Posterior Fossa:       Appears normal         Abdomen:                Appears normal
 Nuchal Fold:           Not applicable (>20    Abdominal Wall:         Appears nml (cord
                        wks GA)                                        insert, abd wall)
 Face:                  Appears normal         Cord Vessels:           Appears normal (3
                        (orbits and profile)                           vessel cord)
 Lips:                  Appears normal         Kidneys:                Appear normal
 Palate:                Not well visualized    Bladder:                Appears normal
 Thoracic:              Appears normal         Spine:                  Appears normal
 Heart:                 Appears normal         Upper Extremities:      Appears normal
                        (4CH, axis, and
                        situs)
 RVOT:                  Appears normal         Lower Extremities:      Appears normal

 Other:  Fetus appears to be a male. Technically difficult due to advanced GA
         and fetal position. Feet visualized. Nasal bone visualized.
Impression

 G5 P4. Patient is here for fetal anatomy scan .
 On cell-free fetal DNA screening, the risks of fetal
 aneuploidies are not increased .
 Obstetric history significant for 4 term vaginal deliveries.

 On ultrasound, fetal biometry is consistent with 28 weeks and
 3 days gestation.  Amniotic fluid is normal good fetal activity
 seen.  Fetal anatomical survey appears normal but limited by
 advanced gestational age.
 Patient is unsure of her LMP date and has not had first of
 [facility] ultrasound.  We have assigned her EDD at
 07/04/2021.

 Patient has an increased carrier risk for spinal muscular
 atrophy. I recommended genetic counseling.

 Patient met with our genetic counselor after ultrasound.  You
 will be receiving a separate letter from our genetic counselor.
 Patient declined partner screening.
Recommendations

 -An appointment was made for her to return in 4 weeks for
 fetal growth assessment.
                 Fifa, Rodolphe
